# Patient Record
Sex: Male | Born: 1935 | Race: White | Hispanic: No | Marital: Married | Smoking: Former smoker
Health system: Southern US, Community
[De-identification: ages and names within clinical notes are randomized; demographics above are authoritative.]

## PROBLEM LIST (undated history)

## (undated) DIAGNOSIS — G20 Parkinson's disease: Secondary | ICD-10-CM

## (undated) DIAGNOSIS — S72009A Fracture of unspecified part of neck of unspecified femur, initial encounter for closed fracture: Secondary | ICD-10-CM

## (undated) DIAGNOSIS — F419 Anxiety disorder, unspecified: Secondary | ICD-10-CM

## (undated) DIAGNOSIS — I10 Essential (primary) hypertension: Secondary | ICD-10-CM

## (undated) DIAGNOSIS — F32A Depression, unspecified: Secondary | ICD-10-CM

## (undated) DIAGNOSIS — G20A1 Parkinson's disease without dyskinesia, without mention of fluctuations: Secondary | ICD-10-CM

## (undated) DIAGNOSIS — F329 Major depressive disorder, single episode, unspecified: Secondary | ICD-10-CM

## (undated) DIAGNOSIS — J449 Chronic obstructive pulmonary disease, unspecified: Secondary | ICD-10-CM

## (undated) DIAGNOSIS — C801 Malignant (primary) neoplasm, unspecified: Secondary | ICD-10-CM

## (undated) DIAGNOSIS — I639 Cerebral infarction, unspecified: Secondary | ICD-10-CM

## (undated) DIAGNOSIS — E785 Hyperlipidemia, unspecified: Secondary | ICD-10-CM

## (undated) HISTORY — DX: Chronic obstructive pulmonary disease, unspecified: J44.9

## (undated) HISTORY — PX: HERNIA REPAIR: SHX51

## (undated) HISTORY — DX: Depression, unspecified: F32.A

## (undated) HISTORY — DX: Hyperlipidemia, unspecified: E78.5

## (undated) HISTORY — DX: Major depressive disorder, single episode, unspecified: F32.9

## (undated) HISTORY — DX: Malignant (primary) neoplasm, unspecified: C80.1

## (undated) HISTORY — DX: Anxiety disorder, unspecified: F41.9

## (undated) HISTORY — PX: EYE SURGERY: SHX253

## (undated) HISTORY — DX: Essential (primary) hypertension: I10

## (undated) HISTORY — PX: FRACTURE SURGERY: SHX138

---

## 2003-01-23 ENCOUNTER — Emergency Department (HOSPITAL_COMMUNITY): Admission: EM | Admit: 2003-01-23 | Discharge: 2003-01-23 | Payer: Self-pay | Admitting: Emergency Medicine

## 2004-08-18 ENCOUNTER — Ambulatory Visit: Payer: Self-pay | Admitting: Internal Medicine

## 2004-08-29 ENCOUNTER — Ambulatory Visit: Payer: Self-pay | Admitting: Internal Medicine

## 2005-11-06 ENCOUNTER — Emergency Department (HOSPITAL_COMMUNITY): Admission: EM | Admit: 2005-11-06 | Discharge: 2005-11-07 | Payer: Self-pay | Admitting: Emergency Medicine

## 2007-04-27 ENCOUNTER — Inpatient Hospital Stay (HOSPITAL_COMMUNITY): Admission: EM | Admit: 2007-04-27 | Discharge: 2007-05-05 | Payer: Self-pay | Admitting: Emergency Medicine

## 2007-04-27 ENCOUNTER — Ambulatory Visit: Payer: Self-pay | Admitting: Vascular Surgery

## 2007-04-28 ENCOUNTER — Encounter (INDEPENDENT_AMBULATORY_CARE_PROVIDER_SITE_OTHER): Payer: Self-pay | Admitting: Internal Medicine

## 2007-08-15 ENCOUNTER — Emergency Department (HOSPITAL_COMMUNITY): Admission: EM | Admit: 2007-08-15 | Discharge: 2007-08-16 | Payer: Self-pay | Admitting: Emergency Medicine

## 2008-10-02 ENCOUNTER — Inpatient Hospital Stay (HOSPITAL_COMMUNITY): Admission: EM | Admit: 2008-10-02 | Discharge: 2008-10-08 | Payer: Self-pay | Admitting: Emergency Medicine

## 2008-10-05 ENCOUNTER — Ambulatory Visit: Payer: Self-pay | Admitting: Physical Medicine & Rehabilitation

## 2008-10-08 ENCOUNTER — Inpatient Hospital Stay (HOSPITAL_COMMUNITY)
Admission: RE | Admit: 2008-10-08 | Discharge: 2008-10-24 | Payer: Self-pay | Admitting: Physical Medicine & Rehabilitation

## 2008-10-08 ENCOUNTER — Ambulatory Visit: Payer: Self-pay | Admitting: Physical Medicine & Rehabilitation

## 2008-12-08 ENCOUNTER — Inpatient Hospital Stay (HOSPITAL_COMMUNITY): Admission: EM | Admit: 2008-12-08 | Discharge: 2008-12-21 | Payer: Self-pay | Admitting: Emergency Medicine

## 2008-12-26 ENCOUNTER — Inpatient Hospital Stay (HOSPITAL_COMMUNITY): Admission: EM | Admit: 2008-12-26 | Discharge: 2009-01-04 | Payer: Self-pay | Admitting: Emergency Medicine

## 2010-02-10 ENCOUNTER — Encounter: Payer: Self-pay | Admitting: Physical Medicine & Rehabilitation

## 2010-04-21 LAB — CBC
HCT: 29.2 % — ABNORMAL LOW (ref 39.0–52.0)
Hemoglobin: 10 g/dL — ABNORMAL LOW (ref 13.0–17.0)
Hemoglobin: 9.4 g/dL — ABNORMAL LOW (ref 13.0–17.0)
MCHC: 32.1 g/dL (ref 30.0–36.0)
MCHC: 32.2 g/dL (ref 30.0–36.0)
MCV: 90.3 fL (ref 78.0–100.0)
Platelets: 323 10*3/uL (ref 150–400)
RBC: 3.23 MIL/uL — ABNORMAL LOW (ref 4.22–5.81)
RDW: 16.2 % — ABNORMAL HIGH (ref 11.5–15.5)
WBC: 5.8 10*3/uL (ref 4.0–10.5)

## 2010-04-21 LAB — BASIC METABOLIC PANEL
BUN: 5 mg/dL — ABNORMAL LOW (ref 6–23)
CO2: 30 mEq/L (ref 19–32)
CO2: 31 mEq/L (ref 19–32)
Calcium: 8 mg/dL — ABNORMAL LOW (ref 8.4–10.5)
Chloride: 103 mEq/L (ref 96–112)
GFR calc Af Amer: >60 mL/min (ref >60)
GFR calc non Af Amer: >60 mL/min (ref >60)
Glucose, Bld: 107 mg/dL — ABNORMAL HIGH (ref 70–99)
Potassium: 3.4 mEq/L — ABNORMAL LOW (ref 3.5–5.1)
Sodium: 139 mEq/L (ref 135–145)
Sodium: 140 mEq/L (ref 135–145)

## 2010-04-21 LAB — DIFFERENTIAL
Basophils Absolute: 0.1 10*3/uL (ref 0.0–0.1)
Basophils Relative: 0 % (ref 0–1)
Basophils Relative: 1 % (ref 0–1)
Eosinophils Absolute: 0.1 10*3/uL (ref 0.0–0.7)
Eosinophils Relative: 1 % (ref 0–5)
Eosinophils Relative: 2 % (ref 0–5)
Lymphs Abs: 1.4 10*3/uL (ref 0.7–4.0)
Monocytes Absolute: 0.5 10*3/uL (ref 0.1–1.0)
Monocytes Absolute: 0.6 10*3/uL (ref 0.1–1.0)
Monocytes Relative: 9 % (ref 3–12)
Neutro Abs: 6.8 10*3/uL (ref 1.7–7.7)
Neutrophils Relative %: 64 % (ref 43–77)

## 2010-04-22 LAB — HEMOCCULT GUIAC POC 1CARD (OFFICE): Fecal Occult Bld: NEGATIVE

## 2010-04-22 LAB — CBC
HCT: 30.5 % — ABNORMAL LOW (ref 39.0–52.0)
HCT: 37 % — ABNORMAL LOW (ref 39.0–52.0)
HCT: 27.1 % — ABNORMAL LOW (ref 39.0–52.0)
HCT: 27.4 % — ABNORMAL LOW (ref 39.0–52.0)
HCT: 28.8 % — ABNORMAL LOW (ref 39.0–52.0)
Hemoglobin: 10.3 g/dL — ABNORMAL LOW (ref 13.0–17.0)
Hemoglobin: 9.4 g/dL — ABNORMAL LOW (ref 13.0–17.0)
Hemoglobin: 12 g/dL — ABNORMAL LOW (ref 13.0–17.0)
Hemoglobin: 9.3 g/dL — ABNORMAL LOW (ref 13.0–17.0)
Hemoglobin: 7.9 g/dL — ABNORMAL LOW (ref 13.0–17.0)
Hemoglobin: 9 g/dL — ABNORMAL LOW (ref 13.0–17.0)
Hemoglobin: 9.4 g/dL — ABNORMAL LOW (ref 13.0–17.0)
MCHC: 33.5 g/dL (ref 30.0–36.0)
MCHC: 32.5 g/dL (ref 30.0–36.0)
MCHC: 32.8 g/dL (ref 30.0–36.0)
MCHC: 31.9 g/dL (ref 30.0–36.0)
MCHC: 33 g/dL (ref 30.0–36.0)
MCV: 89.8 fL (ref 78.0–100.0)
MCV: 90 fL (ref 78.0–100.0)
Platelets: 294 10*3/uL (ref 150–400)
Platelets: 313 10*3/uL (ref 150–400)
Platelets: 337 10*3/uL (ref 150–400)
Platelets: 255 10*3/uL (ref 150–400)
Platelets: 328 10*3/uL (ref 150–400)
Platelets: 211 10*3/uL (ref 150–400)
Platelets: 244 10*3/uL (ref 150–400)
RBC: 3.11 MIL/uL — ABNORMAL LOW (ref 4.22–5.81)
RBC: 3.15 MIL/uL — ABNORMAL LOW (ref 4.22–5.81)
RBC: 2.69 MIL/uL — ABNORMAL LOW (ref 4.22–5.81)
RBC: 3.01 MIL/uL — ABNORMAL LOW (ref 4.22–5.81)
RBC: 3.17 MIL/uL — ABNORMAL LOW (ref 4.22–5.81)
RBC: 3.29 MIL/uL — ABNORMAL LOW (ref 4.22–5.81)
RDW: 15.7 % — ABNORMAL HIGH (ref 11.5–15.5)
RDW: 16.3 % — ABNORMAL HIGH (ref 11.5–15.5)
RDW: 16.5 % — ABNORMAL HIGH (ref 11.5–15.5)
RDW: 16.6 % — ABNORMAL HIGH (ref 11.5–15.5)
WBC: 7.6 10*3/uL (ref 4.0–10.5)
WBC: 7.8 10*3/uL (ref 4.0–10.5)
WBC: 9.2 10*3/uL (ref 4.0–10.5)
WBC: 10.1 10*3/uL (ref 4.0–10.5)
WBC: 10.5 10*3/uL (ref 4.0–10.5)
WBC: 6.8 10*3/uL (ref 4.0–10.5)
WBC: 8.5 10*3/uL (ref 4.0–10.5)

## 2010-04-22 LAB — DIFFERENTIAL
Basophils Absolute: 0.3 10*3/uL — ABNORMAL HIGH (ref 0.0–0.1)
Basophils Absolute: 0 10*3/uL (ref 0.0–0.1)
Basophils Relative: 0 % (ref 0–1)
Basophils Relative: 0 % (ref 0–1)
Eosinophils Absolute: 0 10*3/uL (ref 0.0–0.7)
Eosinophils Relative: 0 % (ref 0–5)
Eosinophils Relative: 0 % (ref 0–5)
Eosinophils Relative: 0 % (ref 0–5)
Eosinophils Relative: 1 % (ref 0–5)
Lymphocytes Relative: 6 % — ABNORMAL LOW (ref 12–46)
Lymphocytes Relative: 12 % (ref 12–46)
Lymphocytes Relative: 14 % (ref 12–46)
Lymphocytes Relative: 19 % (ref 12–46)
Lymphocytes Relative: 20 % (ref 12–46)
Lymphocytes Relative: 20 % (ref 12–46)
Lymphs Abs: 1.5 10*3/uL (ref 0.7–4.0)
Lymphs Abs: 1.6 10*3/uL (ref 0.7–4.0)
Lymphs Abs: 1.8 10*3/uL (ref 0.7–4.0)
Lymphs Abs: 2 10*3/uL (ref 0.7–4.0)
Monocytes Absolute: 0.5 10*3/uL (ref 0.1–1.0)
Monocytes Absolute: 0.5 10*3/uL (ref 0.1–1.0)
Monocytes Absolute: 0.7 10*3/uL (ref 0.1–1.0)
Monocytes Relative: 2 % — ABNORMAL LOW (ref 3–12)
Monocytes Relative: 5 % (ref 3–12)
Monocytes Relative: 5 % (ref 3–12)
Monocytes Relative: 6 % (ref 3–12)
Monocytes Relative: 6 % (ref 3–12)
Monocytes Relative: 6 % (ref 3–12)
Neutro Abs: 12.1 10*3/uL — ABNORMAL HIGH (ref 1.7–7.7)
Neutro Abs: 4.5 10*3/uL (ref 1.7–7.7)
Neutro Abs: 6.9 10*3/uL (ref 1.7–7.7)
Neutro Abs: 7.4 10*3/uL (ref 1.7–7.7)
Neutrophils Relative %: 91 % — ABNORMAL HIGH (ref 43–77)
Neutrophils Relative %: 67 % (ref 43–77)
Neutrophils Relative %: 73 % (ref 43–77)
Neutrophils Relative %: 74 % (ref 43–77)
Neutrophils Relative %: 83 % — ABNORMAL HIGH (ref 43–77)

## 2010-04-22 LAB — CLOSTRIDIUM DIFFICILE EIA: C difficile Toxins A+B, EIA: 10

## 2010-04-22 LAB — BASIC METABOLIC PANEL
BUN: 12 mg/dL (ref 6–23)
BUN: 12 mg/dL (ref 6–23)
BUN: 12 mg/dL (ref 6–23)
BUN: 13 mg/dL (ref 6–23)
CO2: 32 mEq/L (ref 19–32)
Calcium: 7.9 mg/dL — ABNORMAL LOW (ref 8.4–10.5)
Calcium: 8 mg/dL — ABNORMAL LOW (ref 8.4–10.5)
Calcium: 8.1 mg/dL — ABNORMAL LOW (ref 8.4–10.5)
Calcium: 7.6 mg/dL — ABNORMAL LOW (ref 8.4–10.5)
Calcium: 7.8 mg/dL — ABNORMAL LOW (ref 8.4–10.5)
Calcium: 7.9 mg/dL — ABNORMAL LOW (ref 8.4–10.5)
Calcium: 8 mg/dL — ABNORMAL LOW (ref 8.4–10.5)
Chloride: 106 mEq/L (ref 96–112)
Creatinine, Ser: 0.56 mg/dL (ref 0.4–1.5)
Creatinine, Ser: 0.66 mg/dL (ref 0.4–1.5)
Creatinine, Ser: 0.49 mg/dL (ref 0.4–1.5)
Creatinine, Ser: 0.52 mg/dL (ref 0.4–1.5)
Creatinine, Ser: 0.56 mg/dL (ref 0.4–1.5)
GFR calc Af Amer: >60 mL/min (ref >60)
GFR calc Af Amer: >60 mL/min (ref >60)
GFR calc Af Amer: >60 mL/min (ref >60)
GFR calc Af Amer: >60 mL/min (ref >60)
GFR calc Af Amer: >60 mL/min (ref >60)
GFR calc Af Amer: >60 mL/min (ref >60)
GFR calc Af Amer: >60 mL/min (ref >60)
GFR calc non Af Amer: >60 mL/min (ref >60)
GFR calc non Af Amer: >60 mL/min (ref >60)
GFR calc non Af Amer: >60 mL/min (ref >60)
GFR calc non Af Amer: >60 mL/min (ref >60)
GFR calc non Af Amer: >60 mL/min (ref >60)
GFR calc non Af Amer: >60 mL/min (ref >60)
GFR calc non Af Amer: >60 mL/min (ref >60)
GFR calc non Af Amer: >60 mL/min (ref >60)
GFR calc non Af Amer: >60 mL/min (ref >60)
Glucose, Bld: 102 mg/dL — ABNORMAL HIGH (ref 70–99)
Glucose, Bld: 131 mg/dL — ABNORMAL HIGH (ref 70–99)
Potassium: 2.9 mEq/L — ABNORMAL LOW (ref 3.5–5.1)
Potassium: 3.5 mEq/L (ref 3.5–5.1)
Potassium: 3.8 mEq/L (ref 3.5–5.1)
Potassium: 3.9 mEq/L (ref 3.5–5.1)
Sodium: 136 mEq/L (ref 135–145)
Sodium: 138 mEq/L (ref 135–145)
Sodium: 143 mEq/L (ref 135–145)
Sodium: 140 mEq/L (ref 135–145)
Sodium: 141 mEq/L (ref 135–145)

## 2010-04-22 LAB — ANAEROBIC CULTURE

## 2010-04-22 LAB — URINE MICROSCOPIC-ADD ON

## 2010-04-22 LAB — URINALYSIS, ROUTINE W REFLEX MICROSCOPIC
Bilirubin Urine: NEGATIVE
Bilirubin Urine: NEGATIVE
Glucose, UA: NEGATIVE mg/dL
Hgb urine dipstick: NEGATIVE
Hgb urine dipstick: NEGATIVE
Ketones, ur: NEGATIVE mg/dL
Ketones, ur: 15 mg/dL — AB
Nitrite: POSITIVE — AB
Urobilinogen, UA: 0.2 mg/dL (ref 0.0–1.0)
pH: 6.5 (ref 5.0–8.0)

## 2010-04-22 LAB — WOUND CULTURE: Culture: NO GROWTH

## 2010-04-22 LAB — MAGNESIUM
Magnesium: 2 mg/dL (ref 1.5–2.5)
Magnesium: 2.1 mg/dL (ref 1.5–2.5)
Magnesium: 2.4 mg/dL (ref 1.5–2.5)

## 2010-04-22 LAB — HEMOGLOBIN AND HEMATOCRIT, BLOOD
HCT: 26.4 % — ABNORMAL LOW (ref 39.0–52.0)
HCT: 29.5 % — ABNORMAL LOW (ref 39.0–52.0)
Hemoglobin: 8.5 g/dL — ABNORMAL LOW (ref 13.0–17.0)
Hemoglobin: 9.4 g/dL — ABNORMAL LOW (ref 13.0–17.0)

## 2010-04-22 LAB — CULTURE, BLOOD (ROUTINE X 2)

## 2010-04-22 LAB — COMPREHENSIVE METABOLIC PANEL
Albumin: 3.4 g/dL — ABNORMAL LOW (ref 3.5–5.2)
BUN: 13 mg/dL (ref 6–23)
Calcium: 8.8 mg/dL (ref 8.4–10.5)
Glucose, Bld: 133 mg/dL — ABNORMAL HIGH (ref 70–99)
Potassium: 3.7 mEq/L (ref 3.5–5.1)
Total Protein: 6.6 g/dL (ref 6.0–8.3)

## 2010-04-22 LAB — CROSSMATCH: Antibody Screen: NEGATIVE

## 2010-04-22 LAB — PROTIME-INR
INR: 2.02 — ABNORMAL HIGH (ref 0.00–1.49)
INR: 2.3 — ABNORMAL HIGH (ref 0.00–1.49)
INR: 1.65 — ABNORMAL HIGH (ref 0.00–1.49)
Prothrombin Time: 22.7 seconds — ABNORMAL HIGH (ref 11.6–15.2)
Prothrombin Time: 27.2 seconds — ABNORMAL HIGH (ref 11.6–15.2)
Prothrombin Time: 19.4 seconds — ABNORMAL HIGH (ref 11.6–15.2)
Prothrombin Time: 20.1 seconds — ABNORMAL HIGH (ref 11.6–15.2)

## 2010-04-22 LAB — URINE CULTURE
Colony Count: NO GROWTH
Culture: NO GROWTH
Culture: NO GROWTH
Special Requests: NEGATIVE

## 2010-04-22 LAB — APTT: aPTT: 38 seconds — ABNORMAL HIGH (ref 24–37)

## 2010-04-22 LAB — GRAM STAIN

## 2010-04-22 LAB — PHOSPHORUS: Phosphorus: 3.5 mg/dL (ref 2.3–4.6)

## 2010-04-23 LAB — CK TOTAL AND CKMB (NOT AT ARMC)
CK, MB: 1.9 ng/mL (ref 0.3–4.0)
Total CK: 159 U/L (ref 7–232)
Total CK: 160 U/L (ref 7–232)
Total CK: 22 U/L (ref 7–232)

## 2010-04-23 LAB — BASIC METABOLIC PANEL
Calcium: 8.1 mg/dL — ABNORMAL LOW (ref 8.4–10.5)
Calcium: 8.3 mg/dL — ABNORMAL LOW (ref 8.4–10.5)
Calcium: 9 mg/dL (ref 8.4–10.5)
Creatinine, Ser: 0.54 mg/dL (ref 0.4–1.5)
GFR calc Af Amer: >60 mL/min (ref >60)
GFR calc Af Amer: >60 mL/min (ref >60)
GFR calc Af Amer: >60 mL/min (ref >60)
GFR calc Af Amer: >60 mL/min (ref >60)
GFR calc non Af Amer: >60 mL/min (ref >60)
GFR calc non Af Amer: >60 mL/min (ref >60)
GFR calc non Af Amer: >60 mL/min (ref >60)
GFR calc non Af Amer: >60 mL/min (ref >60)
Glucose, Bld: 106 mg/dL — ABNORMAL HIGH (ref 70–99)
Glucose, Bld: 116 mg/dL — ABNORMAL HIGH (ref 70–99)
Potassium: 3.4 mEq/L — ABNORMAL LOW (ref 3.5–5.1)
Potassium: 3.5 mEq/L (ref 3.5–5.1)
Sodium: 133 mEq/L — ABNORMAL LOW (ref 135–145)
Sodium: 135 mEq/L (ref 135–145)
Sodium: 138 mEq/L (ref 135–145)
Sodium: 141 mEq/L (ref 135–145)

## 2010-04-23 LAB — PROTIME-INR
INR: 2.17 — ABNORMAL HIGH (ref 0.00–1.49)
INR: 2.3 — ABNORMAL HIGH (ref 0.00–1.49)
INR: 2.33 — ABNORMAL HIGH (ref 0.00–1.49)
INR: 2.33 — ABNORMAL HIGH (ref 0.00–1.49)
INR: 2.68 — ABNORMAL HIGH (ref 0.00–1.49)
INR: 2.78 — ABNORMAL HIGH (ref 0.00–1.49)
INR: 2.89 — ABNORMAL HIGH (ref 0.00–1.49)
Prothrombin Time: 13.3 seconds (ref 11.6–15.2)
Prothrombin Time: 17.8 seconds — ABNORMAL HIGH (ref 11.6–15.2)
Prothrombin Time: 24 seconds — ABNORMAL HIGH (ref 11.6–15.2)
Prothrombin Time: 25.1 seconds — ABNORMAL HIGH (ref 11.6–15.2)
Prothrombin Time: 25.4 seconds — ABNORMAL HIGH (ref 11.6–15.2)
Prothrombin Time: 29.1 seconds — ABNORMAL HIGH (ref 11.6–15.2)
Prothrombin Time: 30.6 seconds — ABNORMAL HIGH (ref 11.6–15.2)

## 2010-04-23 LAB — URINE MICROSCOPIC-ADD ON

## 2010-04-23 LAB — DIFFERENTIAL
Basophils Absolute: 0 10*3/uL (ref 0.0–0.1)
Lymphocytes Relative: 17 % (ref 12–46)
Lymphs Abs: 1.9 10*3/uL (ref 0.7–4.0)
Monocytes Absolute: 0.6 10*3/uL (ref 0.1–1.0)
Neutro Abs: 8.3 10*3/uL — ABNORMAL HIGH (ref 1.7–7.7)

## 2010-04-23 LAB — TROPONIN I
Troponin I: 0.02 ng/mL (ref 0.00–0.06)
Troponin I: 0.02 ng/mL (ref 0.00–0.06)

## 2010-04-23 LAB — CBC
HCT: 28.2 % — ABNORMAL LOW (ref 39.0–52.0)
HCT: 31.6 % — ABNORMAL LOW (ref 39.0–52.0)
Hemoglobin: 10.3 g/dL — ABNORMAL LOW (ref 13.0–17.0)
Hemoglobin: 10.9 g/dL — ABNORMAL LOW (ref 13.0–17.0)
Hemoglobin: 12.8 g/dL — ABNORMAL LOW (ref 13.0–17.0)
Hemoglobin: 12.9 g/dL — ABNORMAL LOW (ref 13.0–17.0)
Hemoglobin: 8.9 g/dL — ABNORMAL LOW (ref 13.0–17.0)
Hemoglobin: 9.7 g/dL — ABNORMAL LOW (ref 13.0–17.0)
MCHC: 34.5 g/dL (ref 30.0–36.0)
MCHC: 34.5 g/dL (ref 30.0–36.0)
MCV: 89.4 fL (ref 78.0–100.0)
MCV: 90 fL (ref 78.0–100.0)
Platelets: 175 10*3/uL (ref 150–400)
RBC: 2.89 MIL/uL — ABNORMAL LOW (ref 4.22–5.81)
RBC: 3.17 MIL/uL — ABNORMAL LOW (ref 4.22–5.81)
RBC: 3.54 MIL/uL — ABNORMAL LOW (ref 4.22–5.81)
RBC: 4.18 MIL/uL — ABNORMAL LOW (ref 4.22–5.81)
RBC: 4.2 MIL/uL — ABNORMAL LOW (ref 4.22–5.81)
RDW: 14.8 % (ref 11.5–15.5)
RDW: 15.3 % (ref 11.5–15.5)
WBC: 10.1 10*3/uL (ref 4.0–10.5)
WBC: 10.9 10*3/uL — ABNORMAL HIGH (ref 4.0–10.5)
WBC: 12.4 10*3/uL — ABNORMAL HIGH (ref 4.0–10.5)
WBC: 13 10*3/uL — ABNORMAL HIGH (ref 4.0–10.5)

## 2010-04-23 LAB — COMPREHENSIVE METABOLIC PANEL
Albumin: 3.7 g/dL (ref 3.5–5.2)
BUN: 13 mg/dL (ref 6–23)
Calcium: 9.2 mg/dL (ref 8.4–10.5)
Chloride: 101 mEq/L (ref 96–112)
Creatinine, Ser: 0.69 mg/dL (ref 0.4–1.5)
Total Bilirubin: 1.5 mg/dL — ABNORMAL HIGH (ref 0.3–1.2)
Total Protein: 6.7 g/dL (ref 6.0–8.3)

## 2010-04-23 LAB — URINALYSIS, ROUTINE W REFLEX MICROSCOPIC
Glucose, UA: NEGATIVE mg/dL
Glucose, UA: NEGATIVE mg/dL
Hgb urine dipstick: NEGATIVE
Ketones, ur: 40 mg/dL — AB
Leukocytes, UA: NEGATIVE
Nitrite: NEGATIVE
Nitrite: NEGATIVE
Protein, ur: NEGATIVE mg/dL
Protein, ur: NEGATIVE mg/dL
Specific Gravity, Urine: 1.024 (ref 1.005–1.030)
pH: 5.5 (ref 5.0–8.0)
pH: 5.5 (ref 5.0–8.0)
pH: 6 (ref 5.0–8.0)

## 2010-04-23 LAB — URINE CULTURE
Colony Count: NO GROWTH
Colony Count: NO GROWTH
Culture: NO GROWTH

## 2010-04-23 LAB — APTT: aPTT: 25 seconds (ref 24–37)

## 2010-04-24 LAB — BASIC METABOLIC PANEL
BUN: 16 mg/dL (ref 6–23)
CO2: 30 mEq/L (ref 19–32)
Calcium: 8.4 mg/dL (ref 8.4–10.5)
GFR calc non Af Amer: >60 mL/min (ref >60)
Glucose, Bld: 93 mg/dL (ref 70–99)
Sodium: 135 mEq/L (ref 135–145)

## 2010-04-24 LAB — PROTIME-INR
INR: 2.32 — ABNORMAL HIGH (ref 0.00–1.49)
Prothrombin Time: 30.9 seconds — ABNORMAL HIGH (ref 11.6–15.2)
Prothrombin Time: 32 seconds — ABNORMAL HIGH (ref 11.6–15.2)

## 2010-04-25 LAB — CK TOTAL AND CKMB (NOT AT ARMC)
Relative Index: 1.3 (ref 0.0–2.5)
Total CK: 258 U/L — ABNORMAL HIGH (ref 7–232)

## 2010-04-25 LAB — DIFFERENTIAL
Eosinophils Absolute: 0 10*3/uL (ref 0.0–0.7)
Eosinophils Relative: 1 % (ref 0–5)
Eosinophils Relative: 0 % (ref 0–5)
Lymphocytes Relative: 16 % (ref 12–46)
Lymphocytes Relative: 7 % — ABNORMAL LOW (ref 12–46)
Lymphs Abs: 1.6 10*3/uL (ref 0.7–4.0)
Lymphs Abs: 1.4 10*3/uL (ref 0.7–4.0)
Monocytes Absolute: 0.8 10*3/uL (ref 0.1–1.0)
Monocytes Absolute: 0.4 10*3/uL (ref 0.1–1.0)
Monocytes Relative: 2 % — ABNORMAL LOW (ref 3–12)
Neutro Abs: 18.2 10*3/uL — ABNORMAL HIGH (ref 1.7–7.7)
Neutrophils Relative %: 91 % — ABNORMAL HIGH (ref 43–77)

## 2010-04-25 LAB — CBC
HCT: 34.9 % — ABNORMAL LOW (ref 39.0–52.0)
HCT: 30 % — ABNORMAL LOW (ref 39.0–52.0)
HCT: 30.7 % — ABNORMAL LOW (ref 39.0–52.0)
HCT: 34.1 % — ABNORMAL LOW (ref 39.0–52.0)
Hemoglobin: 10.6 g/dL — ABNORMAL LOW (ref 13.0–17.0)
Hemoglobin: 11.7 g/dL — ABNORMAL LOW (ref 13.0–17.0)
MCHC: 34 g/dL (ref 30.0–36.0)
MCHC: 34.7 g/dL (ref 30.0–36.0)
MCHC: 34 g/dL (ref 30.0–36.0)
MCV: 94.1 fL (ref 78.0–100.0)
MCV: 92.4 fL (ref 78.0–100.0)
MCV: 93.1 fL (ref 78.0–100.0)
MCV: 93.5 fL (ref 78.0–100.0)
Platelets: 304 10*3/uL (ref 150–400)
Platelets: 344 10*3/uL (ref 150–400)
Platelets: 139 10*3/uL — ABNORMAL LOW (ref 150–400)
Platelets: 150 10*3/uL (ref 150–400)
RBC: 3.37 MIL/uL — ABNORMAL LOW (ref 4.22–5.81)
RBC: 3.73 MIL/uL — ABNORMAL LOW (ref 4.22–5.81)
RBC: 3.65 MIL/uL — ABNORMAL LOW (ref 4.22–5.81)
RDW: 13 % (ref 11.5–15.5)
RDW: 13.3 % (ref 11.5–15.5)
RDW: 13.1 % (ref 11.5–15.5)
WBC: 10.2 10*3/uL (ref 4.0–10.5)
WBC: 18.3 10*3/uL — ABNORMAL HIGH (ref 4.0–10.5)
WBC: 12.1 10*3/uL — ABNORMAL HIGH (ref 4.0–10.5)

## 2010-04-25 LAB — URINALYSIS, ROUTINE W REFLEX MICROSCOPIC
Bilirubin Urine: NEGATIVE
Glucose, UA: 100 mg/dL — AB
Hgb urine dipstick: NEGATIVE
Protein, ur: 30 mg/dL — AB
Specific Gravity, Urine: 1.025 (ref 1.005–1.030)
Urobilinogen, UA: 1 mg/dL (ref 0.0–1.0)
pH: 5 (ref 5.0–8.0)

## 2010-04-25 LAB — BASIC METABOLIC PANEL
BUN: 22 mg/dL (ref 6–23)
BUN: 12 mg/dL (ref 6–23)
CO2: 31 mEq/L (ref 19–32)
Calcium: 8.6 mg/dL (ref 8.4–10.5)
Chloride: 100 mEq/L (ref 96–112)
Chloride: 99 mEq/L (ref 96–112)
Creatinine, Ser: 0.73 mg/dL (ref 0.4–1.5)
GFR calc non Af Amer: >60 mL/min (ref >60)
GFR calc non Af Amer: >60 mL/min (ref >60)
Glucose, Bld: 92 mg/dL (ref 70–99)
Glucose, Bld: 101 mg/dL — ABNORMAL HIGH (ref 70–99)
Glucose, Bld: 159 mg/dL — ABNORMAL HIGH (ref 70–99)
Potassium: 3.9 mEq/L (ref 3.5–5.1)
Potassium: 3.3 mEq/L — ABNORMAL LOW (ref 3.5–5.1)
Potassium: 3.9 mEq/L (ref 3.5–5.1)
Sodium: 136 mEq/L (ref 135–145)

## 2010-04-25 LAB — TYPE AND SCREEN: ABO/RH(D): A POS

## 2010-04-25 LAB — COMPREHENSIVE METABOLIC PANEL
ALT: 13 U/L (ref 0–53)
AST: 48 U/L — ABNORMAL HIGH (ref 0–37)
Albumin: 3.2 g/dL — ABNORMAL LOW (ref 3.5–5.2)
Alkaline Phosphatase: 88 U/L (ref 39–117)
BUN: 11 mg/dL (ref 6–23)
CO2: 26 mEq/L (ref 19–32)
Calcium: 9 mg/dL (ref 8.4–10.5)
Chloride: 104 mEq/L (ref 96–112)
Chloride: 100 mEq/L (ref 96–112)
Creatinine, Ser: 0.84 mg/dL (ref 0.4–1.5)
Creatinine, Ser: 0.79 mg/dL (ref 0.4–1.5)
GFR calc Af Amer: >60 mL/min (ref >60)
GFR calc non Af Amer: >60 mL/min (ref >60)
Glucose, Bld: 139 mg/dL — ABNORMAL HIGH (ref 70–99)
Potassium: 3.7 mEq/L (ref 3.5–5.1)
Sodium: 139 mEq/L (ref 135–145)
Total Bilirubin: 2.2 mg/dL — ABNORMAL HIGH (ref 0.3–1.2)

## 2010-04-25 LAB — URINE MICROSCOPIC-ADD ON

## 2010-04-25 LAB — PROTIME-INR
INR: 1.9 — ABNORMAL HIGH (ref 0.00–1.49)
INR: 2.3 — ABNORMAL HIGH (ref 0.00–1.49)
INR: 2.3 — ABNORMAL HIGH (ref 0.00–1.49)
INR: 2.4 — ABNORMAL HIGH (ref 0.00–1.49)
INR: 2 — ABNORMAL HIGH (ref 0.00–1.49)
INR: 2.5 — ABNORMAL HIGH (ref 0.00–1.49)
INR: 1.1 (ref 0.00–1.49)
Prothrombin Time: 21.2 seconds — ABNORMAL HIGH (ref 11.6–15.2)
Prothrombin Time: 23.6 seconds — ABNORMAL HIGH (ref 11.6–15.2)
Prothrombin Time: 25 seconds — ABNORMAL HIGH (ref 11.6–15.2)
Prothrombin Time: 25.1 seconds — ABNORMAL HIGH (ref 11.6–15.2)
Prothrombin Time: 25.7 seconds — ABNORMAL HIGH (ref 11.6–15.2)
Prothrombin Time: 27.6 seconds — ABNORMAL HIGH (ref 11.6–15.2)
Prothrombin Time: 22.8 seconds — ABNORMAL HIGH (ref 11.6–15.2)
Prothrombin Time: 25.5 seconds — ABNORMAL HIGH (ref 11.6–15.2)
Prothrombin Time: 25.8 seconds — ABNORMAL HIGH (ref 11.6–15.2)
Prothrombin Time: 14.4 seconds (ref 11.6–15.2)

## 2010-04-25 LAB — URINE CULTURE: Special Requests: NEGATIVE

## 2010-04-25 LAB — HEPATIC FUNCTION PANEL
Indirect Bilirubin: 1 mg/dL — ABNORMAL HIGH (ref 0.3–0.9)
Total Protein: 5.7 g/dL — ABNORMAL LOW (ref 6.0–8.3)

## 2010-04-25 LAB — CULTURE, RESPIRATORY W GRAM STAIN

## 2010-04-25 LAB — POCT I-STAT, CHEM 8
BUN: 17 mg/dL (ref 6–23)
Chloride: 102 mEq/L (ref 96–112)
Creatinine, Ser: 0.8 mg/dL (ref 0.4–1.5)
Glucose, Bld: 130 mg/dL — ABNORMAL HIGH (ref 70–99)
Potassium: 3.8 mEq/L (ref 3.5–5.1)
Sodium: 137 mEq/L (ref 135–145)

## 2010-04-25 LAB — CK: Total CK: 384 U/L — ABNORMAL HIGH (ref 7–232)

## 2010-06-03 NOTE — Consult Note (Signed)
Cole Cunningham, Cole Cunningham       ACCOUNT NO.:  [id number]   MEDICAL RECORD NO.:  [id number]          PATIENT TYPE:  EMS   LOCATION:  MAJO                         FACILITY:  [facility]   PHYSICIAN:  Bevelyn Buckles. Champey, M.D.DATE OF BIRTH:  1932-02-01   DATE OF CONSULTATION:  DATE OF DISCHARGE:                                 CONSULTATION   REFERRING PHYSICIAN:  Dr. Shelly Bombard   REASON FOR CONSULTATION:  Stroke.   HISTORY OF PRESENT ILLNESS:  Cole Cunningham is a 77 year old Asian  male with non-significant past medical history except for excessive  smoker who presents with disorientation, speech difficulties/slurred  speech and right-sided weakness since yesterday.  His wife noticed his  symptoms when she returned from work around 3 p.m. and states she does  not know the exact onset.  The patient also has been unsteady on his  feet secondary to weakness.  Denies any dizziness, vertigo, or loss of  consciousness.  He did have a similar episode 1 year ago as per family.  The patient does complain of some bilateral tremors in his arms and legs  as well.   PAST MEDICAL HISTORY:  None.   MEDICATIONS:  None.   ALLERGIES:  None.   FAMILY HISTORY:  Positive for diabetes.   SOCIAL HISTORY:  The patient lives with wife.  Smokes 1 to 2 packs per  day.  Denies any alcohol use.   REVIEW OF SYSTEMS:  Positive as per History of Present Illness and also  for abdominal discomfort.  Review of systems negative as per History of  Present Illness in greater than 7 other organ systems.   EXAMINATION:  VITALS:  Blood pressure is 164/62, temperature is 97.5,  pulse 58, respirations 18.  HEENT:  Normocephalic, atraumatic.  Extraocular muscles are intact.  Face is symmetric.  HEART:  Regular.  LUNGS:  Clear.  ABDOMEN:  Soft.  EXTREMITIES:  Good pulses.  NEUROLOGICAL:  Patient is awake and alert with dysarthric speech.  Cranial nerves II-XII are grossly intact.  Motor examination shows  slight  weakness, 4+/5 strength in the right extremity.  No drift is  noted.  Sensory examination is within normal to light touch.  Reflexes  are 1+ throughout.  Cerebellar function is within normal limits.  Finger-  to-nose was not assessed secondary to safety.   LABS:  CBC is 9.1, hemoglobin 15.6, hematocrit 45.3, platelets 211.  Sodium is 140, potassium is 4.3, chloride is 106, C02 is 27, BUN 16,  creatinine 1.0, glucose 117.  MRI of the head showed an acute left  lentiform nucleus infarct without arthrosclerotic disease on MRA.   IMPRESSION:  This is a 77 -year-old with a left hemispheric infarct.  Patient is not a candidate for IV TPA.  Symptoms are greater than 3  hours out.  I recommend starting an aspirin a day.  I encouraged the  patient to stop smoking.  Check carotid Dopplers and 2D echocardiogram  testing and homocysteine level.  We will get Physical  Therapy/Occupational Therapy consults.  We will follow the patient as  consultants.      Bevelyn Buckles. Nash Shearer, M.D.  Electronically Signed  DRC/MEDQ  D:  04/27/2007  T:  04/27/2007  Job:  [id number]   cc:   Dr. Shelly Bombard

## 2010-06-03 NOTE — H&P (Signed)
Cole Cunningham, Cole Cunningham       ACCOUNT NO.:  [id number]   MEDICAL RECORD NO.:  [id number]          PATIENT TYPE:  EMS   LOCATION:  MAJO                         FACILITY:  [facility]   PHYSICIAN:  Hollice Espy, M.D.DATE OF BIRTH:  1932-08-12   DATE OF ADMISSION:  04/27/2007  DATE OF DISCHARGE:                              HISTORY & PHYSICAL   PRIMARY CARE Mida Cory:  Dr. Joselyn Arrow.   CONSULTANTS:  Dr. Nash Shearer, neurology.   CHIEF COMPLAINT:  Confusion.   HISTORY OF PRESENT ILLNESS:  The patient is a 77 year old white male  with past medical history of tobacco abuse and hyperlipidemia who,  approximately 24 hours ago, started having problems with confusion,  unsteady gait, and family reports choking on food.  Symptoms persisted,  so they brought him in today.  They noted confusion and that he appeared  to be more withdrawn, sometimes exhibiting curious behavior, saying odd  things.  The patient underwent a CT scan of the head, which showed a  vague area of hypo-attenuation within the left basal ganglia.  He then  went for an MRI, which showed an acute nonhemorrhagic moderate-sized  infarct posterior left lenticular nucleus extending into the mid to  posterior left corona radiata.  He had some old small infarcts as well.  The patient's blood pressure on admission was 164/62.  He has no  reported history of hypertension.  The rest of his labs are essentially  unremarkable.  The patient himself currently is alert.  He appears to be  oriented, at least x2 when he has a very withdrawn manner.  When told  about his stroke, the fact that he has to quit smoking or what was being  done for him, he appears to be very casual about the whole thing.  The  family states this is not normally like him.  The patient tells me he  has no complaints.  Denies any headaches, vision changes, dysphagia.  No  chest pain, palpitations, shortness of breath, wheezing, or coughing.  No abdominal pain,  hematuria, dysuria, constipation, or diarrhea.  He  denies any focal history of numbness, weakness, or pain.   REVIEW OF SYSTEMS:  Otherwise, negative.   PAST MEDICAL HISTORY:  Includes tobacco abuse, episode of reported  hyperlipidemia, diet-controlled.  History of rhabdomyolysis secondary to  Statin.   MEDICATIONS:  He is not on any current medications for anything.  He has  no known drug allergies.   He smokes about 2 packs a day.  No alcohol or drug use.   FAMILY HISTORY:  Noncontributory.   PHYSICAL EXAM:  VITAL SIGNS ON ADMISSION:  Temperature 97.5, heart rate  58, blood pressure 164/62, respirations 20, O2 saturation 96% on room  air.  GENERAL:  He is alert and oriented x3.  No apparent distress.  HEENT:  Normocephalic, atraumatic.  His mucous membranes are dry.  He  has no carotid bruits.  His cranial nerves 2-12 are somewhat difficult  to assess, more to compliance than actually anything else.  Appears to  be intact.  HEART:  Regular rate and rhythm.  Soft S1, S2.  2/6 systolic ejection  murmur.  LUNGS:  Clear to auscultation bilaterally.  ABDOMEN:  Soft, nontender, nondistended.  Positive bowel sounds.  EXTREMITIES:  No cyanosis, clubbing, trace pitting edema.  Grip appears  to be slightly diminished but symmetrical at about a 5-/5, but more  subjective.  Flexion and extension are normal and intact.  Lower  extremities, he has negative Babinski sign.  Sensation appears to be  intact, and he has flexion and extension at the hip, knee, and ankle  intact.   LABORATORY:  White count 9.1, H&H 15.6 and 45 with MCV 92, platelet  count 211,000, 72% shift.  Urinalysis negative.  Sodium 140, potassium  4.2, chloride 106, bicarb not done, BUN 16, creatinine 1, glucose 117.  His MRA of head shows again nonhemorrhagic moderate-sized infarct,  posterior left lobe, extending to mid to posterior left corona radiata,  old small infarct, small vessel disease.  He also has some  slight  narrowing of the proximal right in-dominant artery, left common carotid  artery and left subclavian artery maybe related to artifact and  narrowing, but no evidence of hemodynamically significant stenosis  involving the carotid bifurcation is noted.  However, this is given with  movement.   ASSESSMENT AND PLAN:  1. Acute cerebrovascular accident.  Will be neuro consult, Doppler,      lipids, echo, and homocystine level, check swallowing evaluation,      PT/OT evaluation.  2. Altered mental status, thought to be secondary to #1.  Hydrate.      Continue to follow.  3. Tobacco abuse.  Nicotine patch.  4. Hyperlipidemia.  Will recheck in the morning.      Hollice Espy, M.D.  Electronically Signed     SKK/MEDQ  D:  04/27/2007  T:  04/27/2007  Job:  [id number]   cc:   Lavonda Jumbo, M.D.

## 2010-06-03 NOTE — Discharge Summary (Signed)
NAMECOPELAND, NEISEN       ACCOUNT NO.:  [id number]   MEDICAL RECORD NO.:  [id number]          PATIENT TYPE:  INP   LOCATION:  3036                         FACILITY:  [facility]   PHYSICIAN:  Ramiro Harvest, MD    DATE OF BIRTH:  11-06-1932   DATE OF ADMISSION:  04/27/2007  DATE OF DISCHARGE:  05/05/2007                               DISCHARGE SUMMARY   ADDENDUM   PRIMARY CARE PHYSICIAN:  Lavonda Jumbo, M.D. of [facility].   DISCHARGE DIAGNOSIS:  As per prior discharge summary.   DISCHARGE MEDICATIONS:  1. Lisinopril 10 mg p.o. daily.  2. Nicotine patch 21 mg topically daily.  3. Zetia 10 mg daily.  4. Aspirin 325 mg p.o. daily.  5. Colace 100 mg p.o. daily.   DISPOSITION AND FOLLOWUP:  The patient will be discharged home with home  health PT and OT.  The patient will continue dysphagia 2 diet.  The  patient will need to schedule a followup appointment with Dr. Pearlean Brownie in 4  weeks of [facility] as stated in the prior discharge summary.  Also, the patient is to follow up with PCP in the next 2 to 3 weeks, and  on followup, a basic metabolic profile needs to be checked to follow up  on the patient's electrolytes and renal function and also to follow up  on the patient's blood pressure medications.   HOSPITAL COURSE:  This covers the period between May 03, 2007, and  May 05, 2007.  The patient essentially remained stable.  The patient  was in the hospital awaiting placement for a skilled nursing facility  versus inpatient facility.  It was felt that the patient was too high of  a level for inpatient facility and was denied.  The patient's insurance  also denied a skilled nursing facility as well, and as such, the patient  will be discharged home with home health PT and OT.  The rest of the  patient's medical issues remained stable throughout the hospitalization.  The patient was in stable and improved condition by a day of discharge.   PHYSICAL EXAMINATION:   VITAL SIGNS:  On the day of discharge,  temperature was 97.0, pulse of 55, blood pressure 154/73, respiratory  rate 20, and saturating 94% on room air.   DISCHARGE LABS:  Sodium 142, potassium 3.3, chloride 105, bicarbonate  27, BUN 12, creatinine 0.73, glucose of 99, and calcium of 9.3.   DISPOSITION:  The patient's potassium was repleted prior to discharge,  and the patient will be discharged home in a stable and improved  condition.   It was a pleasure taking care of Mr. Laverne Klugh.      Ramiro Harvest, MD  Electronically Signed     DT/MEDQ  D:  05/05/2007  T:  05/06/2007  Job:  [id number]   cc:   Lavonda Jumbo, M.D.  Pramod P. Pearlean Brownie, MD

## 2010-06-03 NOTE — Discharge Summary (Signed)
Cole Cunningham, [name]       ACCOUNT NO.:  [id number]   MEDICAL RECORD NO.:  [id number]          PATIENT TYPE:  INP   LOCATION:  3036                         FACILITY:  [facility]   PHYSICIAN:  Hollice Espy, M.D.DATE OF BIRTH:  1932/06/07   DATE OF ADMISSION:  04/27/2007  DATE OF DISCHARGE:  05/02/2007                               DISCHARGE SUMMARY   ANTICIPATED DATE OF DISCHARGE:  May 02, 2007.   ATTENDING PHYSICIAN:  Hollice Espy, M.D.   PRIMARY CARE PHYSICIAN:  Lavonda Jumbo, M.D.   DISCHARGE DIAGNOSES:  1. Acute nonhemorrhagic CVA in the posterior left lenticular nucleus      extending to the mid posterior left corona radiata.  2. Altered mental status with cognition impairment secondary to #1.  3. Dysphagia secondary to #1.  4. Newly diagnosed hypertension felt to be contributing factor to #1.  5. Hyperlipidemia.  6. Tobacco abuse.  7. Obesity.   DISCHARGE MEDICATIONS:  1. Discharge medications for this patient are as follows, lisinopril 5      mg daily.  2. Nicotine patch 21 mg topically daily.  3. Aspirin 325 p.o. daily.  4. Zetia 10 p.o. daily.   DIET:  The patient is being put on a heart healthy dysphagia 2 diet with  nectar thick liquid, full supervision and aspiration precaution.   ACTIVITY:  As per skilled nursing facility.   DISPOSITION:  From initial presentation is slight improvement.   FOLLOWUP:  The patient will follow up with Dr. Pearlean Brownie of neurology at  [facility] in 4 weeks.  He will follow up with Dr. Joselyn Arrow in 4 weeks.   HOSPITAL COURSE:  The patient is a 77 year old white male with past  medical history of tobacco abuse and obesity who was brought in for  disorientation and speech difficulties noted 1 day prior to admission.  He had a CT scan of the head which showed an infarct age indeterminate  area and an MRI done confirmed a left hemispheric acute infarct as well  as presence of previous older smaller infarct.   Neurology, Dr. Nash Shearer,  was consulted to evaluated the patient.  He is felt not to be a t-PA  candidate as symptoms were greater than 3 hours out.  The patient was  started on aspirin.  He initially failed a bedside swallowing evaluation  so he was made n.p.o. until a speech therapy evaluation could be done.  In regards to his CVA the patient had lipid profile checked.  He was  found to have an LDL around 125 with a new goal of less than 70 and HDL  of 38.  He is intolerant to statins as he has had previous  rhabdomyolysis report and he was started on Zetia.  He noted to have in  evaluation not any true physical defects but he was found to have some  cognitive impairments and coordination issues and it was recommended  that he either go home with 24 hour supervision and home health PT/OT or  a short term SNF.  Because the family was not able to provide 24 hour  continuous care initially the  plan will be for the patient to be  discharged to a skilled nursing facility as soon as a bed offer is made  and accepted starting May 02, 2007.   In regards to the patient's dysphagia he was evaluated by speech therapy  and found to have significant impairment after a modified barium  swallow.  They recommended the patient for now be on a dysphagia 2  nectar thick liquid with full supervision and aspiration precautions.  The patient's homocysteine level returned back within normal limits.   In regards to his nicotine addiction he was put on a nicotine patch and  is intolerant of this.   In regards to his hypertension initially his blood pressure was elevated  and no new medication was started in terms of concerns about  hypoperfusion.  However, over time his blood pressure has continued to  stay elevated and eventually he was started on lisinopril 5 mg p.o.  daily which has shown some improvement with blood pressure dropping down  into the 130s to 140s.  We will check prior to discharge a BMET  to  ensure that he has no problems with acute renal failure or hyperkalemia  and then the patient will have further followup by Dr. Lavonda Jumbo, his  PCP for this.   An echocardiogram was done pending stroke workup as well as carotid  Doppler.  Carotid Doppler showed nonsignificant bilateral stenosis and  his Doppler showed preserved ejection fraction with no signs of valve  disorder, however, there was some motion artifact noted.  The patient  otherwise is doing well.  His cognitive impairment will determine his  ability to fully participate in rehab.      Hollice Espy, M.D.  Electronically Signed     SKK/MEDQ  D:  05/01/2007  T:  05/01/2007  Job:  [id number]   cc:   Lavonda Jumbo, M.D.  Pramod P. Pearlean Brownie, MD

## 2010-10-14 LAB — BASIC METABOLIC PANEL WITH GFR
BUN: 19
CO2: 24
Calcium: 9.3
Chloride: 104
Creatinine, Ser: 0.72
GFR calc non Af Amer: >60
Glucose, Bld: 104 — ABNORMAL HIGH
Potassium: 3.5
Sodium: 139

## 2010-10-14 LAB — BASIC METABOLIC PANEL
BUN: 12
CO2: 27
Calcium: 9.3
Creatinine, Ser: 0.73
Glucose, Bld: 99
Sodium: 142

## 2010-10-14 LAB — CBC
HCT: 40.7
HCT: 45.3
Hemoglobin: 14.1
Hemoglobin: 15.6
MCHC: 34.5
MCHC: 34.7
MCV: 92.6
MCV: 92.9
Platelets: 190
Platelets: 211
RBC: 4.38
RBC: 4.9
RDW: 13.7
RDW: 14.3
WBC: 10.6 — ABNORMAL HIGH
WBC: 9.1

## 2010-10-14 LAB — POCT I-STAT, CHEM 8
BUN: 16
Calcium, Ion: 1.13
Chloride: 106
Creatinine, Ser: 1
Glucose, Bld: 117 — ABNORMAL HIGH
HCT: 48
Hemoglobin: 16.3
Potassium: 4.3
Sodium: 140
TCO2: 27

## 2010-10-14 LAB — DIFFERENTIAL
Basophils Absolute: 0
Basophils Relative: 0
Eosinophils Absolute: 0
Eosinophils Relative: 0
Lymphocytes Relative: 21
Lymphs Abs: 1.9
Monocytes Absolute: 0.5
Monocytes Relative: 6
Neutro Abs: 6.6
Neutrophils Relative %: 73

## 2010-10-14 LAB — URINALYSIS, ROUTINE W REFLEX MICROSCOPIC
Glucose, UA: NEGATIVE
Ketones, ur: NEGATIVE
Nitrite: NEGATIVE
Protein, ur: NEGATIVE
pH: 6

## 2010-10-14 LAB — HOMOCYSTEINE: Homocysteine: 5.9

## 2010-10-14 LAB — LIPID PANEL: Cholesterol: 207 — ABNORMAL HIGH

## 2010-10-17 LAB — COMPREHENSIVE METABOLIC PANEL
ALT: 32
AST: 27
Albumin: 3.7
Alkaline Phosphatase: 91
BUN: 20
CO2: 26
Calcium: 9.2
Chloride: 106
Creatinine, Ser: 0.83
GFR calc Af Amer: >60
GFR calc non Af Amer: >60
Glucose, Bld: 106 — ABNORMAL HIGH
Potassium: 3.7
Sodium: 141
Total Bilirubin: 0.7
Total Protein: 6.7

## 2010-10-17 LAB — URINALYSIS, ROUTINE W REFLEX MICROSCOPIC
Bilirubin Urine: NEGATIVE
Glucose, UA: NEGATIVE
Ketones, ur: NEGATIVE
Nitrite: NEGATIVE
Protein, ur: 30 — AB
Specific Gravity, Urine: 1.024
Urobilinogen, UA: 0.2
pH: 5.5

## 2010-10-17 LAB — DIFFERENTIAL
Basophils Absolute: 0.1
Basophils Relative: 1
Eosinophils Absolute: 0
Eosinophils Relative: 0
Lymphocytes Relative: 12
Lymphs Abs: 1.4
Monocytes Absolute: 0.4
Monocytes Relative: 3
Neutro Abs: 9.6 — ABNORMAL HIGH
Neutrophils Relative %: 84 — ABNORMAL HIGH

## 2010-10-17 LAB — CBC
HCT: 39.9
Hemoglobin: 13.3
MCHC: 33.5
MCV: 94.3
Platelets: 208
RBC: 4.23
RDW: 13.2
WBC: 11.5 — ABNORMAL HIGH

## 2010-10-17 LAB — URINE MICROSCOPIC-ADD ON

## 2010-10-17 LAB — URINE CULTURE

## 2011-12-30 ENCOUNTER — Encounter (HOSPITAL_COMMUNITY): Payer: Self-pay | Admitting: Emergency Medicine

## 2011-12-30 ENCOUNTER — Inpatient Hospital Stay (HOSPITAL_COMMUNITY)
Admission: EM | Admit: 2011-12-30 | Discharge: 2012-01-13 | DRG: 332 | Disposition: A | Discharge status: Home / Self Care | Payer: Medicare Other | Attending: Internal Medicine | Admitting: Internal Medicine

## 2011-12-30 DIAGNOSIS — K6289 Other specified diseases of anus and rectum: Secondary | ICD-10-CM | POA: Diagnosis present

## 2011-12-30 DIAGNOSIS — D649 Anemia, unspecified: Secondary | ICD-10-CM

## 2011-12-30 DIAGNOSIS — G20 Parkinson's disease: Secondary | ICD-10-CM | POA: Diagnosis present

## 2011-12-30 DIAGNOSIS — K922 Gastrointestinal hemorrhage, unspecified: Secondary | ICD-10-CM

## 2011-12-30 DIAGNOSIS — D509 Iron deficiency anemia, unspecified: Secondary | ICD-10-CM | POA: Diagnosis present

## 2011-12-30 DIAGNOSIS — C189 Malignant neoplasm of colon, unspecified: Secondary | ICD-10-CM

## 2011-12-30 DIAGNOSIS — D72829 Elevated white blood cell count, unspecified: Secondary | ICD-10-CM

## 2011-12-30 DIAGNOSIS — Z8673 Personal history of transient ischemic attack (TIA), and cerebral infarction without residual deficits: Secondary | ICD-10-CM

## 2011-12-30 DIAGNOSIS — Z87891 Personal history of nicotine dependence: Secondary | ICD-10-CM

## 2011-12-30 DIAGNOSIS — G20A1 Parkinson's disease without dyskinesia, without mention of fluctuations: Secondary | ICD-10-CM

## 2011-12-30 DIAGNOSIS — J189 Pneumonia, unspecified organism: Secondary | ICD-10-CM | POA: Diagnosis present

## 2011-12-30 DIAGNOSIS — E876 Hypokalemia: Secondary | ICD-10-CM

## 2011-12-30 DIAGNOSIS — C218 Malignant neoplasm of overlapping sites of rectum, anus and anal canal: Principal | ICD-10-CM | POA: Diagnosis present

## 2011-12-30 DIAGNOSIS — Z01812 Encounter for preprocedural laboratory examination: Secondary | ICD-10-CM

## 2011-12-30 HISTORY — DX: Parkinson's disease without dyskinesia, without mention of fluctuations: G20.A1

## 2011-12-30 HISTORY — DX: Fracture of unspecified part of neck of unspecified femur, initial encounter for closed fracture: S72.009A

## 2011-12-30 HISTORY — DX: Parkinson's disease: G20

## 2011-12-30 HISTORY — DX: Cerebral infarction, unspecified: I63.9

## 2011-12-30 LAB — CBC
HCT: 21.1 % — ABNORMAL LOW (ref 39.0–52.0)
Hemoglobin: 5.2 g/dL — CL (ref 13.0–17.0)
MCH: 15.1 pg — ABNORMAL LOW (ref 26.0–34.0)
MCHC: 24.6 g/dL — ABNORMAL LOW (ref 30.0–36.0)
MCV: 61.3 fL — ABNORMAL LOW (ref 78.0–100.0)
RDW: 21 % — ABNORMAL HIGH (ref 11.5–15.5)

## 2011-12-30 LAB — PROTIME-INR: Prothrombin Time: 15.2 seconds (ref 11.6–15.2)

## 2011-12-30 LAB — COMPREHENSIVE METABOLIC PANEL
AST: 19 U/L (ref 0–37)
Albumin: 2.9 g/dL — ABNORMAL LOW (ref 3.5–5.2)
Alkaline Phosphatase: 108 U/L (ref 39–117)
Chloride: 105 mEq/L (ref 96–112)
Potassium: 2.9 mEq/L — ABNORMAL LOW (ref 3.5–5.1)
Sodium: 140 mEq/L (ref 135–145)
Total Bilirubin: 0.7 mg/dL (ref 0.3–1.2)

## 2011-12-30 LAB — LACTATE DEHYDROGENASE: LDH: 213 U/L (ref 94–250)

## 2011-12-30 LAB — PREPARE RBC (CROSSMATCH)

## 2011-12-30 MED ORDER — ONDANSETRON HCL 4 MG PO TABS
4.0000 mg | ORAL_TABLET | Freq: Four times a day (QID) | ORAL | Status: DC | PRN
  Filled 2011-12-30: qty 1

## 2011-12-30 MED ORDER — POTASSIUM CHLORIDE 10 MEQ/100ML IV SOLN
10.0000 meq | INTRAVENOUS | Status: AC
  Administered 2011-12-31 (×4): 10 meq via INTRAVENOUS
  Filled 2011-12-30 (×4): qty 100

## 2011-12-30 MED ORDER — ACETAMINOPHEN 325 MG PO TABS: 650.0000 mg | ORAL_TABLET | Freq: Four times a day (QID) | ORAL | Status: DC | PRN

## 2011-12-30 MED ORDER — POTASSIUM CHLORIDE 20 MEQ/15ML (10%) PO LIQD
40.0000 meq | Freq: Once | ORAL | Status: AC
  Administered 2011-12-30: 40 meq via ORAL
  Filled 2011-12-30: qty 30

## 2011-12-30 MED ORDER — SODIUM CHLORIDE 0.9 % IV SOLN
INTRAVENOUS | Status: DC
  Administered 2011-12-31: 10 mL/h via INTRAVENOUS
  Administered 2012-01-01: 500 mL via INTRAVENOUS
  Administered 2012-01-05: 10:00:00 via INTRAVENOUS

## 2011-12-30 MED ORDER — ACETAMINOPHEN 650 MG RE SUPP: 650.0000 mg | Freq: Four times a day (QID) | RECTAL | Status: DC | PRN

## 2011-12-30 MED ORDER — ONDANSETRON HCL 4 MG/2ML IJ SOLN: 4.0000 mg | Freq: Four times a day (QID) | INTRAMUSCULAR | Status: DC | PRN

## 2011-12-30 MED ORDER — PANTOPRAZOLE SODIUM 40 MG IV SOLR
40.0000 mg | Freq: Two times a day (BID) | INTRAVENOUS | Status: DC
  Administered 2011-12-31 – 2012-01-05 (×12): 40 mg via INTRAVENOUS
  Filled 2011-12-30 (×17): qty 40

## 2011-12-30 MED ORDER — SODIUM CHLORIDE 0.9 % IJ SOLN
3.0000 mL | Freq: Two times a day (BID) | INTRAMUSCULAR | Status: DC
  Administered 2011-12-31 – 2012-01-05 (×8): 3 mL via INTRAVENOUS

## 2011-12-30 MED ORDER — POTASSIUM CHLORIDE CRYS ER 20 MEQ PO TBCR: 40.0000 meq | EXTENDED_RELEASE_TABLET | Freq: Once | ORAL | Status: DC

## 2011-12-30 NOTE — Progress Notes (Signed)
Pt arrived via stretcher to [phone] with wife at bedside.  Pt has dysphagia and communicates through wife.  Denied any complaints.  VSS.  Oriented x4.  Oriented pt and wife to room and unit visitor guidelines.  Safety video viewed.  Instructed to use call bell for assistance.

## 2011-12-30 NOTE — ED Notes (Signed)
Spoke to MD about switching potassium to liquid per pt request.

## 2011-12-30 NOTE — ED Notes (Signed)
MD at bedside. 

## 2011-12-30 NOTE — H&P (Signed)
Cole Cunningham is an 78 y.o. male.   Patient was seen and examined on December 30, 2011. PCP - Dr. Lurena Cunningham at [facility]. Chief Complaint: Weakness. HPI: 78 year old male with history of Parkinson's disease was found to be increasingly weak lethargic over the last one week as witnessed by patient's wife. Patient was taken to her PCPs office and was found to have a hemoglobin on 5 and was referred to the ER. In the ER repeat hemoglobin was around 5 and patient was admitted from a further management. Patient's wife did not notice any blood in the stool or black stools and patient does not take any NSAIDs. Patient's stool for occult blood was positive and patient has been admitted for further management. Patient is presently hemodynamically stable.  Past Medical History  Diagnosis Date  . Stroke   . Hip fracture   . Parkinson's disease     Past Surgical History  Procedure Date  . Fracture surgery     Family History  Problem Relation Age of Onset  . Other Neg Hx    Social History:  reports that he has quit smoking. He does not have any smokeless tobacco history on file. He reports that he does not drink alcohol or use illicit drugs.  Allergies: No Known Allergies  Medications Prior to Admission  Medication Sig Dispense Refill  . aspirin EC 81 MG tablet Take 81 mg by mouth daily.      . Multiple Vitamin (MULTIVITAMIN WITH MINERALS) TABS Take 1 tablet by mouth daily.        Results for orders placed during the hospital encounter of 12/30/11 (from the past 48 hour(s))  COMPREHENSIVE METABOLIC PANEL     Status: Abnormal   Collection Time   12/30/11  7:43 PM      Component Value Range Comment   Sodium 140  135 - 145 mEq/L    Potassium 2.9 (*) 3.5 - 5.1 mEq/L    Chloride 105  96 - 112 mEq/L    CO2 26  19 - 32 mEq/L    Glucose, Bld 94  70 - 99 mg/dL    BUN 13  6 - 23 mg/dL    Creatinine, Ser 1.61  0.50 - 1.35 mg/dL    Calcium 8.4  8.4 - 09.6 mg/dL    Total  Protein 5.8 (*) 6.0 - 8.3 g/dL    Albumin 2.9 (*) 3.5 - 5.2 g/dL    AST 19  0 - 37 U/L    ALT 13  0 - 53 U/L    Alkaline Phosphatase 108  39 - 117 U/L    Total Bilirubin 0.7  0.3 - 1.2 mg/dL    GFR calc non Af Amer >90  >90 mL/min    GFR calc Af Amer >90  >90 mL/min   APTT     Status: Normal   Collection Time   12/30/11  7:43 PM      Component Value Range Comment   aPTT 32  24 - 37 seconds   PROTIME-INR     Status: Normal   Collection Time   12/30/11  7:43 PM      Component Value Range Comment   Prothrombin Time 15.2  11.6 - 15.2 seconds    INR 1.22  0.00 - 1.49   LACTIC ACID, PLASMA     Status: Normal   Collection Time   12/30/11  7:57 PM      Component Value Range Comment   Lactic  Acid, Venous 2.0  0.5 - 2.2 mmol/L   SEDIMENTATION RATE     Status: Normal   Collection Time   12/30/11  8:00 PM      Component Value Range Comment   Sed Rate 4  0 - 16 mm/hr   TYPE AND SCREEN     Status: Normal (Preliminary result)   Collection Time   12/30/11  8:10 PM      Component Value Range Comment   ABO/RH(D) A POS      Antibody Screen NEG      Sample Expiration 01/02/2012      Unit Number [id number]      Blood Component Type RBC LR PHER2      Unit division 00      Status of Unit ALLOCATED      Transfusion Status OK TO TRANSFUSE      Crossmatch Result Compatible      Unit Number [id number]      Blood Component Type RBC LR PHER2      Unit division 00      Status of Unit ISSUED      Transfusion Status OK TO TRANSFUSE      Crossmatch Result Compatible     ABO/RH     Status: Normal   Collection Time   12/30/11  8:10 PM      Component Value Range Comment   ABO/RH(D) A POS     OCCULT BLOOD, POC DEVICE     Status: Normal   Collection Time   12/30/11  8:25 PM      Component Value Range Comment   Fecal Occult Bld POSITIVE     PREPARE RBC (CROSSMATCH)     Status: Normal   Collection Time   12/30/11 10:00 PM      Component Value Range Comment   Order Confirmation ORDER  PROCESSED BY BLOOD BANK      No results found.  Review of Systems  Constitutional: Positive for malaise/fatigue.  HENT: Negative.   Eyes: Negative.   Respiratory: Negative.   Cardiovascular: Negative.   Gastrointestinal: Negative.   Genitourinary: Negative.   Musculoskeletal: Negative.   Skin: Negative.   Neurological: Positive for weakness.  Endo/Heme/Allergies: Negative.   Psychiatric/Behavioral: Negative.     Blood pressure 140/69, pulse 76, temperature 98.6 F (37 C), temperature source Oral, resp. rate 20, height 5\' 8"  (1.727 m), weight 62.8 kg (138 lb 7.2 oz), SpO2 99.00%. Physical Exam  Constitutional: He appears well-nourished. No distress.  HENT:  Head: Normocephalic and atraumatic.  Right Ear: External ear normal.  Left Ear: External ear normal.  Nose: Nose normal.  Mouth/Throat: Oropharynx is clear and moist. No oropharyngeal exudate.  Eyes: Pupils are equal, round, and reactive to light. Right eye exhibits no discharge. Left eye exhibits no discharge. No scleral icterus.  Neck: Normal range of motion. Neck supple.  Cardiovascular: Normal rate and regular rhythm.   Respiratory: Effort normal and breath sounds normal. No respiratory distress. He has no wheezes. He has no rales.  GI: Soft. Bowel sounds are normal. He exhibits no distension. There is no tenderness. There is no rebound.  Musculoskeletal: He exhibits no edema and no tenderness.  Neurological: He is alert.       Oriented to name and place. Moves all extremities.  Skin: He is not diaphoretic. There is pallor.     Assessment/Plan #1. Symptomatic severe anemia secondary to GI bleed - GI bleed is probably chronic. Patient has been placed on clear liquid  diet and 2 units of packed red blood cell has been ordered for transfusion. Consult GI in a.m. #2. Hypokalemia - probably from poor oral intake. Patient's wife states that patient has not been eating well for last few days. Replaced through IV and orally  recheck metabolic panel in a.m. #3. History of Parkinson's disease - continue home medications.  CODE STATUS - full code.  Cole Cunningham N. 12/30/2011, 11:35 PM

## 2011-12-30 NOTE — ED Notes (Signed)
Pt has been feeling tired and not wanting to get out of bed for past week.  Today pt was seen by his MD had labs drawn and given flu shot.  Tonight pt was called and told to come to ED ref. Hgb 5.6 and Hct.20.  Pt denies any pain, nausea, vomiting or blood in stools. Skin warm and dry color pale.

## 2011-12-30 NOTE — ED Provider Notes (Signed)
History     CSN: [id number]  Arrival date & time 12/30/11  6644   First MD Initiated Contact with Patient 12/30/11 1950      Chief Complaint  Patient presents with  . Abnormal Lab    (Consider location/radiation/quality/duration/timing/severity/associated sxs/prior treatment) HPI Comments: Called in for anemia of 5.2 today  Patient is a 78 y.o. male presenting with general illness.  Illness  The current episode started more than 1 week ago. The onset was gradual. The problem occurs continuously. The problem has been unchanged. The problem is severe. Nothing relieves the symptoms. Nothing aggravates the symptoms. Pertinent negatives include no fever, no abdominal pain, no vomiting and no cough.    Past Medical History  Diagnosis Date  . Stroke   . Hip fracture     Past Surgical History  Procedure Date  . Fracture surgery     No family history on file.  History  Substance Use Topics  . Smoking status: Former Games developer  . Smokeless tobacco: Not on file  . Alcohol Use: No      Review of Systems  Constitutional: Negative for fever and chills.  Respiratory: Negative for cough and shortness of breath.   Cardiovascular: Negative for chest pain and leg swelling.  Gastrointestinal: Negative for vomiting and abdominal pain.  All other systems reviewed and are negative.    Allergies  Review of patient's allergies indicates not on file.  Home Medications  No current outpatient prescriptions on file.  BP 131/44  Pulse 71  Temp 98.5 F (36.9 C) (Oral)  Resp 16  SpO2 100%  Physical Exam  Nursing note and vitals reviewed. Constitutional: He is oriented to person, place, and time. He appears well-developed and well-nourished. No distress.  HENT:  Head: Normocephalic and atraumatic.  Mouth/Throat: No oropharyngeal exudate.  Eyes: EOM are normal. Pupils are equal, round, and reactive to light.       Conjunctival pallor present  Neck: Normal range of motion. Neck  supple.  Cardiovascular: Normal rate and regular rhythm.  Exam reveals no friction rub.   No murmur heard. Pulmonary/Chest: Effort normal and breath sounds normal. No respiratory distress. He has no wheezes. He has no rales.  Abdominal: He exhibits no distension. There is no tenderness. There is no rebound.  Musculoskeletal: Normal range of motion. He exhibits edema (1+bilaterally).  Neurological: He is alert and oriented to person, place, and time.  Skin: No rash noted. He is not diaphoretic.    ED Course  Procedures (including critical care time)  Labs Reviewed  COMPREHENSIVE METABOLIC PANEL - Abnormal; Notable for the following:    Potassium 2.9 (*)     Total Protein 5.8 (*)     Albumin 2.9 (*)     All other components within normal limits  APTT  PROTIME-INR  LACTIC ACID, PLASMA  SEDIMENTATION RATE  TYPE AND SCREEN  OCCULT BLOOD, POC DEVICE  ABO/RH  PREPARE RBC (CROSSMATCH)  LACTATE DEHYDROGENASE  HAPTOGLOBIN  FERRITIN  IRON AND TIBC  C-REACTIVE PROTEIN   No results found.   Date: 12/30/2011  Rate: 72  Rhythm: normal sinus rhythm  QRS Axis: normal  Intervals: normal  ST/T Wave abnormalities: normal  Conduction Disutrbances:none  Narrative Interpretation:   Old EKG Reviewed: none available    Impression: 1. Anemia 2. Weakness    MDM   71M with hx of stroke presents with severe anemia. Went to the PCP today for weakness/fatigue for past week. Called tonight with a critical Hgb of 5.6,  Hct of 20. Came here for admission. Patient here denies chest pain, SOB, fever, recent illness. AFVSS, no tachycardia or hypotension. Patient pale, conjunctival pallor present.  Hemoccult positive. Labs ordered. Plan to admit.           Elwin Mocha, MD 12/30/11 [phone]

## 2011-12-31 LAB — CBC
HCT: 24.6 % — ABNORMAL LOW (ref 39.0–52.0)
MCHC: 27.6 g/dL — ABNORMAL LOW (ref 30.0–36.0)
RDW: 24.6 % — ABNORMAL HIGH (ref 11.5–15.5)
WBC: 4.6 10*3/uL (ref 4.0–10.5)

## 2011-12-31 LAB — COMPREHENSIVE METABOLIC PANEL
ALT: 10 U/L (ref 0–53)
AST: 21 U/L (ref 0–37)
Albumin: 2.6 g/dL — ABNORMAL LOW (ref 3.5–5.2)
Alkaline Phosphatase: 98 U/L (ref 39–117)
BUN: 11 mg/dL (ref 6–23)
Chloride: 105 mEq/L (ref 96–112)
Potassium: 4.3 mEq/L (ref 3.5–5.1)
Sodium: 137 mEq/L (ref 135–145)
Total Bilirubin: 2.9 mg/dL — ABNORMAL HIGH (ref 0.3–1.2)
Total Protein: 5.3 g/dL — ABNORMAL LOW (ref 6.0–8.3)

## 2011-12-31 LAB — HEMOGLOBIN AND HEMATOCRIT, BLOOD
HCT: 25.4 % — ABNORMAL LOW (ref 39.0–52.0)
Hemoglobin: 7.2 g/dL — ABNORMAL LOW (ref 13.0–17.0)

## 2011-12-31 LAB — FERRITIN: Ferritin: 4 ng/mL — ABNORMAL LOW (ref 22–322)

## 2011-12-31 LAB — HAPTOGLOBIN: Haptoglobin: 81 mg/dL (ref 45–215)

## 2011-12-31 LAB — IRON AND TIBC

## 2011-12-31 MED ORDER — SODIUM CHLORIDE 0.9 % IV SOLN: INTRAVENOUS | Status: DC

## 2011-12-31 NOTE — Progress Notes (Signed)
INITIAL NUTRITION ASSESSMENT  DOCUMENTATION CODES Per approved criteria  -Not Applicable    INTERVENTION: 1. Magic cup TID between meals, each supplement provides 290 kcal and 9 grams of protein. Once diet advanced 2. RD will continue to follow    NUTRITION DIAGNOSIS: Predicted sub-optimal intake related to change in environment as evidenced by hx of weight loss.   Goal: PO intake to meet>/=90% estimated nutrition needs  Monitor:  PO intake, weight, labs, I/O's  Reason for Assessment: MST (Malnutrition Screening Tool)   78 y.o. male  Admitting Dx: Anemia  ASSESSMENT: Spoke with pt's wife, states that pt lost about 15 lbs in the spring after an illness and has not been able to gain the weight back despite eating well. With hx of parkinson's may have increased energy expenditure, did not notice any tremor or increased activity at time of RD visit.  Does not like Ensure or other shakes. Does like ice cream, will add Magic cup one diet is advanced.   Height: Ht Readings from Last 1 Encounters:  12/30/11 5\' 8"  (1.727 m)   Weight: Wt Readings from Last 1 Encounters:  12/30/11 138 lb 7.2 oz (62.8 kg)   Ideal Body Weight: 70 kg   % Ideal Body Weight: 89%  Wt Readings from Last 10 Encounters:  12/30/11 138 lb 7.2 oz (62.8 kg)   Usual Body Weight: 153 lbs   % Usual Body Weight: 90%  BMI:  Body mass index is 21.05 kg/(m^2). WNL   Estimated Nutritional Needs: Kcal: 1500-1750 Protein: 60-70 gm  Fluid: 1.5-1.7 L   Skin: no breakdown noted. Mild pitting edema to BLE   Diet Order: NPO  EDUCATION NEEDS: -No education needs identified at this time   Intake/Output Summary (Last 24 hours) at 12/31/11 1542 Last data filed at 12/31/11 1302  Gross per 24 hour  Intake   1330 ml  Output    400 ml  Net    930 ml   Last BM: not documented    Labs:   Lab 12/31/11 0555 12/30/11 1943  NA 137 140  K 4.3 2.9*  CL 105 105  CO2 25 26  BUN 11 13  CREATININE 0.58 0.66   CALCIUM 8.1* 8.4  MG -- --  PHOS -- --  GLUCOSE 81 94    CBG (last 3)  No results found for this basename: GLUCAP:3 in the last 72 hours  Scheduled Meds:   . pantoprazole (PROTONIX) IV  40 mg Intravenous Q12H  . [COMPLETED] potassium chloride  10 mEq Intravenous Q1 Hr x 4  . [COMPLETED] potassium chloride  40 mEq Oral Once  . sodium chloride  3 mL Intravenous Q12H  . [DISCONTINUED] potassium chloride SA  40 mEq Oral Once   Continuous Infusions:   . sodium chloride 10 mL/hr at 12/31/11 0500  . sodium chloride      Past Medical History  Diagnosis Date  . Stroke   . Hip fracture   . Parkinson's disease     Past Surgical History  Procedure Date  . Fracture surgery       Clarene Duke RD, LDN Pager [phone] After Hours pager [phone]

## 2011-12-31 NOTE — Care Management Note (Addendum)
    Page 1 of 2   01/12/2012     3:46:22 PM   CARE MANAGEMENT NOTE 01/12/2012  Patient:  Cole Cunningham, Cole Cunningham   Account Number:  [id number]  Date Initiated:  12/31/2011  Documentation initiated by:  Letha Cape  Subjective/Objective Assessment:   dx anemia  admit-lives with spouse. pt has a rolling walker , hosp bed 2 lift chairs,  bsc , wheelchair and power chair.     Action/Plan:   pt eval- hhpt   Anticipated DC Date:  01/13/2012   Anticipated DC Plan:  HOME W HOME HEALTH SERVICES      DC Planning Services  CM consult      [facility] Choice  HOME HEALTH   Choice offered to / List presented to:  C-3 Spouse        HH arranged  HH-1 RN  HH-2 PT      Status of service:  Completed, signed off Medicare Important Message given?   (If response is "NO", the following Medicare IM given date fields will be blank) Date Medicare IM given:   Date Additional Medicare IM given:    Discharge Disposition:  HOME W HOME HEALTH SERVICES  Per UR Regulation:  Reviewed for med. necessity/level of care/duration of stay  If discussed at Long Length of Stay Meetings, dates discussed:   01/07/2012    Comments:  01/12/12 15:45 Letha Cape RN, BSN [phone] patient for dc on 12/25 with [facility] services, Hilda Lias with [facility] notified on 12/24.  01/08/12 11:52 Letha Cape RN, BSN [phone] spoke with patient's wife, she states she would like to work with [facility], referral given to [facility], Lupita Leash notified.  Soc will begin 24-48 hrs post discharge.  Patient will most likely dc Mon or Tues.  01/08/12 10:29 Letha Cape RN, BSN [phone] patient is s/p resection, now patient has pot op ileus. Will advance to sips of clears today, if tolerates then will advance diet over next couple of days.  12/31/11 11:52 Letha Cape RN,BSN [phone] patient lives with spouse, patient states he is independent at home, he has a rolling walker, a bsc, and a cane. Patient has medication coverage and transportation at  discharge.   NCM will continue to follow for dc needs.

## 2011-12-31 NOTE — Consult Note (Signed)
[facility] Gastroenterology Consultation Note  Referring Provider: Dr. Midge Minium [facility]) Primary Care Physician:  Claude Manges, FNP Primary Gastroenterologist:  Dr. Carman Ching  Reason for Consultation:  Iron-deficiency anemia  HPI: Cole Cunningham is a 78 y.o. male who was admitted for, and we've been asked to see because of, anemia.  For the past several weeks, patient has had progressive fatigue and lethargy.  Denies any nausea, vomiting, change in bowel habits.  Has intermittent history dysphagia per chart, but denies this upon direct questioning.  Has some intermittent hematochezia, and based on my review of Dr. Randa Evens' outpatient chart, this has been ongoing for the past couple years.  Has lost about 15 lbs slowly over the past few years. He endorses having endoscopy and colonoscopy many years ago, but I can't find any record of this.  Labs showed Hgb 6.8 with microcytosis and iron studies (iron, %saturation, ferritin) consistent with iron deficiency.     Past Medical History  Diagnosis Date  . Stroke   . Hip fracture   . Parkinson's disease     Past Surgical History  Procedure Date  . Fracture surgery     Prior to Admission medications   Medication Sig Start Date End Date Taking? Authorizing Provider  aspirin EC 81 MG tablet Take 81 mg by mouth daily.   Yes Historical Provider, MD  Multiple Vitamin (MULTIVITAMIN WITH MINERALS) TABS Take 1 tablet by mouth daily.   Yes Historical Provider, MD    Current Facility-Administered Medications  Medication Dose Route Frequency Provider Last Rate Last Dose  . 0.9 %  sodium chloride infusion   Intravenous Continuous Eduard Clos, MD 10 mL/hr at 12/31/11 0500    . acetaminophen (TYLENOL) tablet 650 mg  650 mg Oral Q6H PRN Eduard Clos, MD       Or  . acetaminophen (TYLENOL) suppository 650 mg  650 mg Rectal Q6H PRN Eduard Clos, MD      . ondansetron [facility]) tablet 4 mg  4 mg Oral Q6H PRN Eduard Clos, MD       Or  . ondansetron [facility]) injection 4 mg  4 mg Intravenous Q6H PRN Eduard Clos, MD      . pantoprazole (PROTONIX) injection 40 mg  40 mg Intravenous Q12H Eduard Clos, MD   40 mg at 12/31/11 1003  . [COMPLETED] potassium chloride 10 mEq in 100 mL IVPB  10 mEq Intravenous Q1 Hr x 4 Eduard Clos, MD   10 mEq at 12/31/11 0450  . [COMPLETED] potassium chloride 20 MEQ/15ML (10%) liquid 40 mEq  40 mEq Oral Once Loren Racer, MD   40 mEq at 12/30/11 2210  . sodium chloride 0.9 % injection 3 mL  3 mL Intravenous Q12H Eduard Clos, MD   3 mL at 12/31/11 0157  . [DISCONTINUED] potassium chloride SA (K-DUR,KLOR-CON) CR tablet 40 mEq  40 mEq Oral Once Elwin Mocha, MD        Allergies as of 12/30/2011  . (No Known Allergies)    Family History  Problem Relation Age of Onset  . Other Neg Hx     History   Social History  . Marital Status: Married    Spouse Name: N/A    Number of Children: N/A  . Years of Education: N/A   Occupational History  . Not on file.   Social History Main Topics  . Smoking status: Former Games developer  . Smokeless tobacco: Not on file  . Alcohol  Use: No  . Drug Use: No  . Sexually Active:    Other Topics Concern  . Not on file   Social History Narrative  . No narrative on file    Review of Systems: ROS Dr. Toniann Fail 12/30/11 reviewed and I agree Physical Exam: Vital signs in last 24 hours: Temp:  [97.6 F (36.4 C)-98.7 F (37.1 C)] 98.7 F (37.1 C) (12/12 0459) Pulse Rate:  [71-84] 78  (12/12 0459) Resp:  [13-31] 18  (12/12 0459) BP: (116-140)/(35-74) 132/61 mmHg (12/12 0459) SpO2:  [98 %-100 %] 98 % (12/12 0459) Weight:  [62.8 kg (138 lb 7.2 oz)] 62.8 kg (138 lb 7.2 oz) (12/11 2246) Last BM Date: 12/29/11 General:   Alert,  Well-developed, well-nourished, pleasant and cooperative in NAD Head:  Normocephalic and atraumatic. Eyes:  Sclera clear, no icterus.   Conjunctiva pale. Ears:  Normal auditory  acuity. Nose:  No deformity, discharge,  or lesions. Mouth:  No deformity or lesions.  Oropharynx pale. Neck:  Supple; no masses or thyromegaly. Lungs:  Clear throughout to auscultation.   No wheezes, crackles, or rhonchi. No acute distress. Heart:  Regular rate and rhythm; no murmurs, clicks, rubs,  or gallops. Abdomen:  Soft, nontender and nondistended. No masses, hepatosplenomegaly or hernias noted. Normal bowel sounds, without guarding, and without rebound.     Msk:  Symmetrical without gross deformities. Normal posture. Pulses:  Normal pulses noted. Extremities:  Without clubbing or edema. Neurologic:  Alert and  oriented x4;  Bradykinetic with flat facies (Parkinson's) Skin:  Pale.Intact without significant lesions or rashes.. Psych:  Bradykinetic, flat facies   Lab Results:  Basename 12/31/11 0555 12/30/11 2000  WBC 4.6 4.6  HGB 6.8* 5.2*  HCT 24.6* 21.1*  PLT 230 260   BMET  Basename 12/31/11 0555 12/30/11 1943  NA 137 140  K 4.3 2.9*  CL 105 105  CO2 25 26  GLUCOSE 81 94  BUN 11 13  CREATININE 0.58 0.66  CALCIUM 8.1* 8.4   LFT  Basename 12/31/11 0555  PROT 5.3*  ALBUMIN 2.6*  AST 21  ALT 10  ALKPHOS 98  BILITOT 2.9*  BILIDIR --  IBILI --   PT/INR  Basename 12/30/11 1943  LABPROT 15.2  INR 1.22    Studies/Results: No results found.  Impression:  1.  Microcytic anemia. 2.  Hematochezia, mild, intermittent.  This appears to be at least intermittently ongoing for the past couple years.  Unclear if this is playing any role into the patient's anemia.  Plan:  1.  Full liquid diet today, NPO after midnight. 2.  Endoscopy tomorrow. 3.  If endoscopy is unrevealing, will likely then need colonoscopy. 4.  Will follow; thank you for the consult.   LOS: 1 day   Juneau Doughman M  12/31/2011, 12:52 PM

## 2011-12-31 NOTE — Progress Notes (Signed)
[facility] PROGRESS NOTE  Tomer Chalmers Hirschman JXB:[id number] DOB: April 26, 1932 DOA: 12/30/2011 PCP: Claude Manges, FNP  Assessment/Plan: 1. Symptomatic severe anemia secondary to GI bleed - .status post transfusion of 2 units of packed red blood cells, but hemoglobin is still 6.8 and this a.m. we'll recheck and further transfuse if still less than 7 -I. have consulted [facility] for further recommendations #2. Hypokalemia - probably from poor oral intake. Repleted #3. History of Parkinson's disease - continue home medications. PT OT consult     Code Status: FULL Family Communication: Wife at bedside Disposition Plan: Pending PT eval   Consultants:  GI consulted-eval pending  Procedures:  NONE  Antibiotics:  NONE  HPI/Subjective: Patient denies hematemesis, also denies hematochezia  Objective: Filed Vitals:   12/31/11 0100 12/31/11 0200 12/31/11 0240 12/31/11 0459  BP: 132/47 137/45 128/44 132/61  Pulse: 75 75 77 78  Temp: 98.1 F (36.7 C) 97.7 F (36.5 C) 98.2 F (36.8 C) 98.7 F (37.1 C)  TempSrc: Oral Oral Oral Oral  Resp: 20 18 20 18   Height:      Weight:      SpO2:    98%    Intake/Output Summary (Last 24 hours) at 12/31/11 0819 Last data filed at 12/31/11 0500  Gross per 24 hour  Intake   1090 ml  Output    200 ml  Net    890 ml   Filed Weights   12/30/11 2246  Weight: 62.8 kg (138 lb 7.2 oz)    Exam:   General:  He is alert, answers simple questions appropriately but speech and difficult to understand(per wife since his stroke)  Cardiovascular: Regular rate and rhythm, normal S1-S2  Respiratory: Decreased breath sounds at bases, otherwise clear to auscultation  Abdomen: Soft, bowel sounds present nontender and nondistended no organomegaly and no mass palpable  Data Reviewed: Basic Metabolic Panel:  Lab 12/31/11 1308 12/30/11 1943  NA 137 140  K 4.3 2.9*  CL 105 105  CO2 25 26  GLUCOSE 81 94  BUN 11 13  CREATININE 0.58  0.66  CALCIUM 8.1* 8.4  MG -- --  PHOS -- --   Liver Function Tests:  Lab 12/31/11 0555 12/30/11 1943  AST 21 19  ALT 10 13  ALKPHOS 98 108  BILITOT 2.9* 0.7  PROT 5.3* 5.8*  ALBUMIN 2.6* 2.9*   No results found for this basename: LIPASE:5,AMYLASE:5 in the last 168 hours No results found for this basename: AMMONIA:5 in the last 168 hours CBC:  Lab 12/31/11 0555  WBC 4.6  NEUTROABS --  HGB 6.8*  HCT 24.6*  MCV 66.7*  PLT 230   Cardiac Enzymes: No results found for this basename: CKTOTAL:5,CKMB:5,CKMBINDEX:5,TROPONINI:5 in the last 168 hours BNP (last 3 results) No results found for this basename: PROBNP:3 in the last 8760 hours CBG: No results found for this basename: GLUCAP:5 in the last 168 hours  No results found for this or any previous visit (from the past 240 hour(s)).   Studies: No results found.  Scheduled Meds:   . pantoprazole (PROTONIX) IV  40 mg Intravenous Q12H  . [COMPLETED] potassium chloride  10 mEq Intravenous Q1 Hr x 4  . [COMPLETED] potassium chloride  40 mEq Oral Once  . sodium chloride  3 mL Intravenous Q12H  . [DISCONTINUED] potassium chloride SA  40 mEq Oral Once   Continuous Infusions:   . sodium chloride 10 mL/hr at 12/31/11 0500    Principal Problem:  *Anemia Active Problems:  GI bleed  Parkinson's disease  Hypokalemia    Time spent: 90    [facility] C  [facility] Pager [phone]. If 8PM-8AM, please contact night-coverage at [email or web address], password [facility] 12/31/2011, 8:19 AM  LOS: 1 day

## 2012-01-01 ENCOUNTER — Encounter (HOSPITAL_COMMUNITY)
Admission: EM | Disposition: A | Discharge status: Home / Self Care | Payer: Self-pay | Source: Home / Self Care | Attending: Internal Medicine

## 2012-01-01 ENCOUNTER — Encounter (HOSPITAL_COMMUNITY): Payer: Self-pay

## 2012-01-01 HISTORY — PX: ESOPHAGOGASTRODUODENOSCOPY: SHX5428

## 2012-01-01 LAB — CBC
Hemoglobin: 6.6 g/dL — CL (ref 13.0–17.0)
Platelets: 217 10*3/uL (ref 150–400)
RBC: 3.49 MIL/uL — ABNORMAL LOW (ref 4.22–5.81)
WBC: 6.7 10*3/uL (ref 4.0–10.5)

## 2012-01-01 LAB — PREPARE RBC (CROSSMATCH)

## 2012-01-01 SURGERY — EGD (ESOPHAGOGASTRODUODENOSCOPY)
Anesthesia: Moderate Sedation | Laterality: Left

## 2012-01-01 MED ORDER — MIDAZOLAM HCL 10 MG/2ML IJ SOLN
INTRAMUSCULAR | Status: DC | PRN
  Administered 2012-01-01: 1 mg via INTRAVENOUS

## 2012-01-01 MED ORDER — FENTANYL CITRATE 0.05 MG/ML IJ SOLN
INTRAMUSCULAR | Status: DC | PRN
  Administered 2012-01-01: 25 ug via INTRAVENOUS

## 2012-01-01 MED ORDER — BUTAMBEN-TETRACAINE-BENZOCAINE 2-2-14 % EX AERO
INHALATION_SPRAY | CUTANEOUS | Status: DC | PRN
  Administered 2012-01-01: 2 via TOPICAL

## 2012-01-01 MED ORDER — SODIUM CHLORIDE 0.9 % IV SOLN
INTRAVENOUS | Status: DC
  Administered 2012-01-02: 500 mL via INTRAVENOUS

## 2012-01-01 MED ORDER — PEG 3350-KCL-NA BICARB-NACL 420 G PO SOLR
4000.0000 mL | Freq: Once | ORAL | Status: AC
  Administered 2012-01-01: 4000 mL via ORAL
  Filled 2012-01-01 (×2): qty 4000

## 2012-01-01 MED ORDER — FLEET ENEMA 7-19 GM/118ML RE ENEM
2.0000 | ENEMA | Freq: Once | RECTAL | Status: AC
  Administered 2012-01-02: 1 via RECTAL
  Administered 2012-01-02: 2 via RECTAL
  Filled 2012-01-01 (×2): qty 2

## 2012-01-01 MED ORDER — FENTANYL CITRATE 0.05 MG/ML IJ SOLN
INTRAMUSCULAR | Status: AC
  Filled 2012-01-01: qty 2

## 2012-01-01 MED ORDER — MIDAZOLAM HCL 5 MG/ML IJ SOLN
INTRAMUSCULAR | Status: AC
  Filled 2012-01-01: qty 1

## 2012-01-01 NOTE — Interval H&P Note (Signed)
History and Physical Interval Note:  01/01/2012 8:44 AM  Cole Cunningham  has presented today for surgery, with the diagnosis of iron-deficiency anemia  The various methods of treatment have been discussed with the patient and family. After consideration of risks, benefits and other options for treatment, the patient has consented to  Procedure(s) (LRB) with comments: ESOPHAGOGASTRODUODENOSCOPY (EGD) (Left) as a surgical intervention .  The patient's history has been reviewed, patient examined, no change in status, stable for surgery.  I have reviewed the patient's chart and labs.  Questions were answered to the patient's satisfaction.     [name],Kerrie Latour L

## 2012-01-01 NOTE — Progress Notes (Signed)
Patient had negative EGD this morning. I have seen him approximately 3 years ago primarily for dysphagia it was felt to be due to his Parkinson's and he had workup at that time included during swallow et Karie Soda. He had undergone hip surgery and was on Coumadin. He had had some rectal bleeding with a hard bowel movement and on one occasion on Coumadin and was seen in the office and had been impacted with heme-negative stool. He had had previous colonoscopy by Dr. Corinda Gubler 2008 that his wife reported was negative. Long discussion with her and he continues to have constipation and she has to clean up his pants and clean him up after going to the bathroom. She has not seen any visible blood. In view of his profound anemia and negative EGD, we will plan a colonoscopy in the morning. I have discussed this with the wife and she is agreeable. We will set it up in the morning for Dr. Madilyn Fireman.

## 2012-01-01 NOTE — Evaluation (Signed)
Physical Therapy Evaluation Patient Details Name: Cole Cunningham MRN: [id number] DOB: [birth year] Today's Date: 01/01/2012 Time: 8295-6213 PT Time Calculation (min): 37 min  PT Assessment / Plan / Recommendation Clinical Impression  Pt adm with GI bleed.  Pt also with Parkinson's.  Needs skilled PT to maximize I and safety to decr burden of care.  Pt should be able to return home with wife.    PT Assessment  Patient needs continued PT services    Follow Up Recommendations  Home health PT (unless wife declines it)    Does the patient have the potential to tolerate intense rehabilitation      Barriers to Discharge        Equipment Recommendations       Recommendations for Other Services     Frequency Min 3X/week    Precautions / Restrictions Precautions Precautions: Fall   Pertinent Vitals/Pain N/A      Mobility  Bed Mobility Bed Mobility: Supine to Sit;Sitting - Scoot to Edge of Bed Supine to Sit: 4: Min assist;HOB elevated Sitting - Scoot to [place] of Bed: 3: Mod assist Details for Bed Mobility Assistance: assist to bring hips to EOB Transfers Transfers: Sit to Stand;Stand to Sit Sit to Stand: 4: Min assist;With upper extremity assist;With armrests;From bed;From chair/3-in-1 Stand to Sit: 4: Min assist;With upper extremity assist;With armrests;To chair/3-in-1;To bed Details for Transfer Assistance: Pt needs incr time and slight lift of hips. Ambulation/Gait Ambulation/Gait Assistance: 4: Min assist Ambulation Distance (Feet): 100 Feet Assistive device: Rolling walker Ambulation/Gait Assistance Details: Verbal cues to stay closer to walker and keep feet inside walker. Gait Pattern: Step-to pattern;Decreased step length - right;Decreased step length - left;Shuffle;Trunk flexed Gait velocity: decr    Shoulder Instructions     Exercises     PT Diagnosis: Difficulty walking;Generalized weakness  PT Problem List: Decreased strength;Decreased  balance;Decreased mobility;Decreased knowledge of use of DME PT Treatment Interventions: DME instruction;Gait training;Functional mobility training;Patient/family education;Therapeutic activities;Therapeutic exercise;Balance training   PT Goals Acute Rehab PT Goals PT Goal Formulation: With patient/family Time For Goal Achievement: 01/08/12 Potential to Achieve Goals: Good Pt will go Supine/Side to Sit: with supervision PT Goal: Supine/Side to Sit - Progress: Goal set today Pt will go Sit to Supine/Side: with supervision PT Goal: Sit to Supine/Side - Progress: Goal set today Pt will go Sit to Stand: with supervision PT Goal: Sit to Stand - Progress: Goal set today Pt will go Stand to Sit: with supervision PT Goal: Stand to Sit - Progress: Goal set today Pt will Ambulate: 51 - 150 feet;with supervision PT Goal: Ambulate - Progress: Goal set today  Visit Information  Last PT Received On: 01/01/12 Assistance Needed: +1    Subjective Data  Subjective: Pt only nodding head. Patient Stated Goal: Wife wants pt to come home.   Prior Functioning  Home Living Lives With: Spouse Available Help at Discharge: Family Type of Home: House Home Access: Level entry Home Layout: One level Bathroom Shower/Tub: Engineer, manufacturing systems: Standard Home Adaptive Equipment: Grab bars around toilet;Grab bars in shower;Tub transfer bench;Walker - rolling;Bedside commode/3-in-1;Hand-held shower hose (lift chair) Prior Function Level of Independence: Independent with assistive device(s) (for transfers and gait.) Driving: No Vocation: Retired    IT consultant  Overall Cognitive Status: History of cognitive impairments - at baseline Arousal/Alertness: Awake/alert Behavior During Session: Flat affect    Extremity/Trunk Assessment Right Lower Extremity Assessment RLE ROM/Strength/Tone: Deficits RLE ROM/Strength/Tone Deficits: grossly 3+/5 Left Lower Extremity Assessment LLE ROM/Strength/Tone:  Deficits LLE ROM/Strength/Tone Deficits:  grossly 3+/5   Balance Static Standing Balance Static Standing - Balance Support: Bilateral upper extremity supported (on walker) Static Standing - Level of Assistance: 4: Min assist  End of Session PT - End of Session Equipment Utilized During Treatment: Gait belt Activity Tolerance: Patient tolerated treatment well Patient left: in chair;with call bell/phone within reach;with family/visitor present Nurse Communication: Mobility status  GP     [name] 01/01/2012, 4:27 PM  [employer] PT [phone]

## 2012-01-01 NOTE — Progress Notes (Signed)
[facility] PROGRESS NOTE  Miqueas Whilden Hargens YQM:[id number] DOB: [birth year] DOA: 12/30/2011 PCP: Claude Manges, FNP  Assessment/Plan: 1. Symptomatic severe anemia secondary to GI bleed - .status post transfusion of 2 units of packed red blood cells, but hemoglobin is 6.6 this a.m. 12/13-been transfused 2 units of packed red blood cells follow and recheck. -EGD negative, appreciate GI assistance, colonoscopy planned for the morning. #2. Hypokalemia - probably from poor oral intake. Repleted #3. History of Parkinson's disease - continue home medications. PT OT consult     Code Status: FULL Family Communication: Wife at bedside Disposition Plan: Pending PT eval   Consultants:  GI consulted-eval pending  Procedures:  EGD 12/13-negative  Antibiotics:  NONE  HPI/Subjective: Status post EGD, denies any complaints.  Objective: Filed Vitals:   01/01/12 0835 01/01/12 0840 01/01/12 0845 01/01/12 0850  BP: 135/58 135/58 128/59 128/59  Pulse: 65     Temp:      TempSrc:      Resp: 16 48 14 28  Height:      Weight:      SpO2: 98% 99% 99% 99%    Intake/Output Summary (Last 24 hours) at 01/01/12 0957 Last data filed at 01/01/12 0644  Gross per 24 hour  Intake  542.5 ml  Output    200 ml  Net  342.5 ml   Filed Weights   12/30/11 2246  Weight: 62.8 kg (138 lb 7.2 oz)    Exam:   General:  He is alert, answers simple questions appropriately but speech difficult to understand(per wife since his stroke)  Cardiovascular: Regular rate and rhythm, normal S1-S2  Respiratory: Decreased breath sounds at bases, otherwise clear to auscultation  Abdomen: Soft, bowel sounds present nontender and nondistended no organomegaly and no mass palpable  Data Reviewed: Basic Metabolic Panel:  Lab 12/31/11 5284 12/30/11 1943  NA 137 140  K 4.3 2.9*  CL 105 105  CO2 25 26  GLUCOSE 81 94  BUN 11 13  CREATININE 0.58 0.66  CALCIUM 8.1* 8.4  MG -- --  PHOS -- --    Liver Function Tests:  Lab 12/31/11 0555 12/30/11 1943  AST 21 19  ALT 10 13  ALKPHOS 98 108  BILITOT 2.9* 0.7  PROT 5.3* 5.8*  ALBUMIN 2.6* 2.9*   No results found for this basename: LIPASE:5,AMYLASE:5 in the last 168 hours No results found for this basename: AMMONIA:5 in the last 168 hours CBC:  Lab 01/01/12 0430 12/31/11 1532 12/31/11 0555 12/30/11 2000  WBC 6.7 -- 4.6 4.6  NEUTROABS -- -- -- --  HGB 6.6* 7.2* 6.8* 5.2*  HCT 23.0* 25.4* 24.6* 21.1*  MCV 65.9* -- 66.7* 61.3*  PLT 217 -- 230 260   Cardiac Enzymes: No results found for this basename: CKTOTAL:5,CKMB:5,CKMBINDEX:5,TROPONINI:5 in the last 168 hours BNP (last 3 results) No results found for this basename: PROBNP:3 in the last 8760 hours CBG: No results found for this basename: GLUCAP:5 in the last 168 hours  No results found for this or any previous visit (from the past 240 hour(s)).   Studies: No results found.  Scheduled Meds:    . pantoprazole (PROTONIX) IV  40 mg Intravenous Q12H  . polyethylene glycol-electrolytes  4,000 mL Oral Once  . sodium chloride  3 mL Intravenous Q12H   Continuous Infusions:    . sodium chloride 500 mL (01/01/12 0754)  . sodium chloride      Principal Problem:  *Anemia Active Problems:  GI bleed  Parkinson's disease  Hypokalemia  Time spent: 30    [facility] C  [facility] Pager [phone]. If 8PM-8AM, please contact night-coverage at [email or web address], password [email or web address] 01/01/2012, 9:57 AM  LOS: 2 days

## 2012-01-01 NOTE — Progress Notes (Signed)
CRITICAL VALUE ALERT  Critical value received:  Hgb 6.6  Date of notification:  01/01/12  Time of notification:  0550  Critical value read back: yes  Nurse who received alert:  Jacquiline Doe, RN  MD notified (1st page):  [place]  Time of first page:  0600  Responding MD:  Georgia Dom  Time MD responded: [phone]

## 2012-01-01 NOTE — Op Note (Signed)
[name], [name], Cole Cunningham  MR#: [id number] BIRTHDATE: [birth year] , 79  yrs. old GENDER: Male ENDOSCOPIST:Babetta Paterson Randa Evens, MD REFERRED BY: [facility]. PCP: Mayer Masker PROCEDURE DATE:  01/01/2012 PROCEDURE:   EGD ASA CLASS:  class III INDICATIONS:   profound iron deficiency anemia MEDICATION:  fentanyl 25 mcg Versed 1 mg IV TOPICAL ANESTHETIC:   Cetacaine spray  DESCRIPTION OF PROCEDURE:   Pentax endoscope introduced into the esophagus with swallowing. Esophagus carefully examined and was normal. The GE junction was normal there was no significant hiatal hernia. No esophagitis or varices were seen. The stomach was entered and examined in the forward and retroflex view and found to be normal. The duodenal bulb and second duodenum were normal. There was no coffee ground material and no signs of active or recent bleeding.     COMPLICATIONS: None  ENDOSCOPIC IMPRESSION: 1. Profound iron deficiency anemia with negative EGD  RECOMMENDATIONS:  1. Colonoscopy tomorrow morning by Dr. Madilyn Fireman. Have discussed the procedure with the patient's wife who is his POA.  _______________________________ Rosalie DoctorCarman Ching, MD 01/01/2012 8:47 AM  CC: Deboraha Sprang at [facility]

## 2012-01-01 NOTE — Progress Notes (Signed)
Attempted to encourage pt to drink colon prep.  Pt unable to continue.  Paged [facility] to speak with MD on-call.  Will attempt to try an alternative method.

## 2012-01-02 ENCOUNTER — Inpatient Hospital Stay (HOSPITAL_COMMUNITY): Payer: Medicare Other

## 2012-01-02 ENCOUNTER — Encounter (HOSPITAL_COMMUNITY): Payer: Self-pay | Admitting: Gastroenterology

## 2012-01-02 ENCOUNTER — Encounter (HOSPITAL_COMMUNITY)
Admission: EM | Disposition: A | Discharge status: Home / Self Care | Payer: Self-pay | Source: Home / Self Care | Attending: Internal Medicine

## 2012-01-02 DIAGNOSIS — D72829 Elevated white blood cell count, unspecified: Secondary | ICD-10-CM | POA: Diagnosis present

## 2012-01-02 DIAGNOSIS — D490 Neoplasm of unspecified behavior of digestive system: Secondary | ICD-10-CM

## 2012-01-02 HISTORY — PX: FLEXIBLE SIGMOIDOSCOPY: SHX5431

## 2012-01-02 LAB — CBC
Hemoglobin: 10.3 g/dL — ABNORMAL LOW (ref 13.0–17.0)
MCH: 21.4 pg — ABNORMAL LOW (ref 26.0–34.0)
MCV: 70.3 fL — ABNORMAL LOW (ref 78.0–100.0)
RBC: 4.82 MIL/uL (ref 4.22–5.81)
RDW: 26.5 % — ABNORMAL HIGH (ref 11.5–15.5)

## 2012-01-02 LAB — TYPE AND SCREEN
ABO/RH(D): A POS
Unit division: 0
Unit division: 0

## 2012-01-02 LAB — PRO B NATRIURETIC PEPTIDE: Pro B Natriuretic peptide (BNP): 1043 pg/mL — ABNORMAL HIGH (ref 0–450)

## 2012-01-02 LAB — CEA: CEA: 1.7 ng/mL (ref 0.0–5.0)

## 2012-01-02 SURGERY — SIGMOIDOSCOPY, FLEXIBLE
Anesthesia: Moderate Sedation

## 2012-01-02 MED ORDER — LEVOFLOXACIN IN D5W 750 MG/150ML IV SOLN
750.0000 mg | INTRAVENOUS | Status: DC
  Administered 2012-01-02 – 2012-01-08 (×7): 750 mg via INTRAVENOUS
  Filled 2012-01-02 (×8): qty 150

## 2012-01-02 MED ORDER — FENTANYL CITRATE 0.05 MG/ML IJ SOLN
INTRAMUSCULAR | Status: DC | PRN
  Administered 2012-01-02: 25 ug via INTRAVENOUS

## 2012-01-02 MED ORDER — IOHEXOL 300 MG/ML  SOLN
20.0000 mL | INTRAMUSCULAR | Status: AC
  Administered 2012-01-02 (×2): 20 mL via ORAL

## 2012-01-02 MED ORDER — MIDAZOLAM HCL 5 MG/5ML IJ SOLN
INTRAMUSCULAR | Status: DC | PRN
  Administered 2012-01-02: 2 mg via INTRAVENOUS

## 2012-01-02 MED ORDER — IOHEXOL 300 MG/ML  SOLN
100.0000 mL | Freq: Once | INTRAMUSCULAR | Status: AC | PRN
  Administered 2012-01-02: 100 mL via INTRAVENOUS

## 2012-01-02 MED ORDER — FENTANYL CITRATE 0.05 MG/ML IJ SOLN
INTRAMUSCULAR | Status: AC
  Filled 2012-01-02: qty 2

## 2012-01-02 MED ORDER — MIDAZOLAM HCL 5 MG/ML IJ SOLN
INTRAMUSCULAR | Status: AC
  Filled 2012-01-02: qty 2

## 2012-01-02 NOTE — Progress Notes (Signed)
[facility] PROGRESS NOTE  Cole Cunningham ZOX:[id number] DOB: [birth year] DOA: 12/30/2011 PCP: Claude Manges, FNP  Assessment/Plan:   #1.Rectal mass-nearly obstructing, likely cancer per GI -Status post colonoscopy today, surgery consult -CT scan of abdomen and pelvis with no definite evidence of metastatic disease -Appreciate GI and surgery assistance. #2. Symptomatic severe anemia secondary to GI bleed -secondary to #1, status post transfusion of 2 units of packed red blood cells on admission, but hemoglobin was 6.6 12/13- was transfused another 2 units of packed red blood cells and hgb improved to 10.3 this a.m.12/14 -EGD negative, appreciate GI assistance, colonoscopy as above #3. Hypokalemia - probably from poor oral intake. Repleted #4. History of Parkinson's disease - continue home medications.  #5 leukocytosis/Probable PNA  -CT scan of abdomen and pelvis with bilateral lower airspace disease suspicious for pulmonary edema or pneumonia  -will check BNP, obtain chest x-ray, empiric antibiotics and follow.   Code Status: FULL Family Communication: Wife at bedside Disposition Plan: Pending PT eval   Consultants:  GI consulted-eval pending  Procedures:  EGD 12/13-negative  Antibiotics:  NONE  HPI/Subjective: Pt denies any complaints. Wife at bedside  Objective: Filed Vitals:   01/02/12 1005 01/02/12 1007 01/02/12 1010 01/02/12 1015  BP: 147/62 136/68 136/68 135/64  Pulse:  67    Temp:  98.5 F (36.9 C)    TempSrc:  Oral    Resp: 17 17 16 18   Height:      Weight:      SpO2: 98% 99% 99% 100%    Intake/Output Summary (Last 24 hours) at 01/02/12 1412 Last data filed at 01/02/12 0931  Gross per 24 hour  Intake    468 ml  Output    400 ml  Net     68 ml   Filed Weights   12/30/11 2246  Weight: 62.8 kg (138 lb 7.2 oz)    Exam:   General:  He is alert, answers simple questions appropriately but speech difficult to understand(per wife since  his stroke)  Cardiovascular: Regular rate and rhythm, normal S1-S2  Respiratory: Decreased breath sounds at bases, otherwise clear to auscultation  Abdomen: Soft, bowel sounds present nontender and nondistended no organomegaly and no mass palpable  Data Reviewed: Basic Metabolic Panel:  Lab 12/31/11 8119 12/30/11 1943  NA 137 140  K 4.3 2.9*  CL 105 105  CO2 25 26  GLUCOSE 81 94  BUN 11 13  CREATININE 0.58 0.66  CALCIUM 8.1* 8.4  MG -- --  PHOS -- --   Liver Function Tests:  Lab 12/31/11 0555 12/30/11 1943  AST 21 19  ALT 10 13  ALKPHOS 98 108  BILITOT 2.9* 0.7  PROT 5.3* 5.8*  ALBUMIN 2.6* 2.9*   No results found for this basename: LIPASE:5,AMYLASE:5 in the last 168 hours No results found for this basename: AMMONIA:5 in the last 168 hours CBC:  Lab 01/02/12 0522 01/01/12 0430 12/31/11 1532 12/31/11 0555 12/30/11 2000  WBC 13.9* 6.7 -- 4.6 4.6  NEUTROABS -- -- -- -- --  HGB 10.3* 6.6* 7.2* 6.8* 5.2*  HCT 33.9* 23.0* 25.4* 24.6* 21.1*  MCV 70.3* 65.9* -- 66.7* 61.3*  PLT 230 217 -- 230 260   Cardiac Enzymes: No results found for this basename: CKTOTAL:5,CKMB:5,CKMBINDEX:5,TROPONINI:5 in the last 168 hours BNP (last 3 results) No results found for this basename: PROBNP:3 in the last 8760 hours CBG: No results found for this basename: GLUCAP:5 in the last 168 hours  No results found for this or  any previous visit (from the past 240 hour(s)).   Studies: No results found.  Scheduled Meds:    . pantoprazole (PROTONIX) IV  40 mg Intravenous Q12H  . sodium chloride  3 mL Intravenous Q12H   Continuous Infusions:    . sodium chloride 500 mL (01/01/12 0754)    Principal Problem:  *Anemia Active Problems:  GI bleed  Parkinson's disease  Hypokalemia    Time spent: 35    [facility] C  [facility] Pager [phone]. If 8PM-8AM, please contact night-coverage at [email or web address], password [facility] 01/02/2012, 2:12 PM  LOS: 3 days

## 2012-01-02 NOTE — Progress Notes (Signed)
ANTIBIOTIC CONSULT NOTE - INITIAL  Pharmacy Consult for Levaquin Indication: pneumonia  No Known Allergies  Patient Measurements: Height: 5\' 8"  (172.7 cm) Weight: 138 lb 7.2 oz (62.8 kg) IBW/kg (Calculated) : 68.4   Vital Signs: Temp: 98.4 F (36.9 C) (12/14 1447) Temp src: Oral (12/14 1447) BP: 147/64 mmHg (12/14 1447) Pulse Rate: 88  (12/14 1447) Intake/Output from previous day: 12/13 0701 - 12/14 0700 In: 960.5 [P.O.:838; I.V.:100; Blood:12.5; IV Piggyback:10] Out: 400 [Urine:400] Intake/Output from this shift:    Labs:  Basename 01/02/12 0522 01/01/12 0430 12/31/11 1532 12/31/11 0555 12/30/11 1943  WBC 13.9* 6.7 -- 4.6 --  HGB 10.3* 6.6* 7.2* -- --  PLT 230 217 -- 230 --  LABCREA -- -- -- -- --  CREATININE -- -- -- 0.58 0.66   Estimated Creatinine Clearance: 68.7 ml/min (by C-G formula based on Cr of 0.58). No results found for this basename: VANCOTROUGH:2,VANCOPEAK:2,VANCORANDOM:2,GENTTROUGH:2,GENTPEAK:2,GENTRANDOM:2,TOBRATROUGH:2,TOBRAPEAK:2,TOBRARND:2,AMIKACINPEAK:2,AMIKACINTROU:2,AMIKACIN:2, in the last 72 hours   Microbiology: No results found for this or any previous visit (from the past 720 hour(s)).  Medical History: Past Medical History  Diagnosis Date  . Stroke   . Hip fracture   . Parkinson's disease     Assessment:  Mr Olesen is a 9 yom admitted with weakness, found to have GI bleed and is to start levaquin per pharmacy for probably pna as well. WBC 13.9 and pt is afebrile. His renal function is ok with an estimated crcl of 15ml/min. No cultures drawn   Plan:  Levaquin 750mg  IV daily  F/u renal function   Thank you,  Brett Fairy, PharmD, BCPS 01/02/2012 6:50 PM

## 2012-01-02 NOTE — Consult Note (Signed)
This patient is medically disable, but physiologically able to tolerate an operation.  What that operation will be has to be determined by his preoperative workup   The official reading of his chest, abdomen and pelvic CT scan is not back yet, but I do not see anything other than calcified gallstones.  No evidence of metastatic disease.  Marta Lamas. Gae Bon, MD, FACS [phone] [facility]

## 2012-01-02 NOTE — Op Note (Signed)
[facility] [address] [place], [address]   COLONOSCOPY PROCEDURE REPORT  PATIENT: Cole Cunningham, Cole Cunningham  MR#: [id number] BIRTHDATE: 1932-10-12 , 79  yrs. old GENDER: Male ENDOSCOPIST: Dorena Cookey, MD REFERRED BY: PROCEDURE DATE:  01/02/2012 PROCEDURE: ASA CLASS: INDICATIONS: severe anemia with negative upper endoscopy MEDICATIONS:   fentanyl 50 mcg, Versed 2 mg.  DESCRIPTION OF PROCEDURE: at about 12 cm from the anal verge there was the distal end of a hard circumferential nearly obstructing mass typical of rectal adenocarcinoma. There was a very small pinpoint lumen which the scope would not traverse beyond. The rectum distal to it appeared normal to the anus. Multiple biopsies were taken     COMPLICATIONS: None  ENDOSCOPIC IMPRESSION:nearly obstructing rectal mass likely carcinoma.  RECOMMENDATIONS:staging CT scan and surgical consult.    _______________________________ eSignedDorena Cookey, MD 01/02/2012 10:10 AM

## 2012-01-02 NOTE — Progress Notes (Signed)
OT Cancellation Note  Patient Details Name: ANTIONE OBAR MRN: [id number] DOB: 07-16-1932   Cancelled Treatment:    Reason Eval/Treat Not Completed: Patient at procedure or test/ unavailable (colonoscopy). Will re-attempt as time allows otherwise will see next date.  01/02/2012 Cipriano Mile OTR/L Pager [phone] Office [phone]

## 2012-01-02 NOTE — Progress Notes (Signed)
[facility] Progress Note  Subjective: Patient tolerated prep but did not get much results.  Objective: Vital signs in last 24 hours: Temp:  [97.8 F (36.6 C)-98.9 F (37.2 C)] 98.5 F (36.9 C) (12/14 1007) Pulse Rate:  [67-89] 67  (12/14 1007) Resp:  [15-25] 17  (12/14 1007) BP: (119-151)/(48-85) 136/68 mmHg (12/14 1007) SpO2:  [93 %-99 %] 99 % (12/14 1007) Weight change:    PE: Abdomen soft, slightly distended.  Lab Results: Results for orders placed during the hospital encounter of 12/30/11 (from the past 24 hour(s))  CBC     Status: Abnormal   Collection Time   01/02/12  5:22 AM      Component Value Range   WBC 13.9 (*) 4.0 - 10.5 K/uL   RBC 4.82  4.22 - 5.81 MIL/uL   Hemoglobin 10.3 (*) 13.0 - 17.0 g/dL   HCT 16.1 (*) 09.6 - 04.5 %   MCV 70.3 (*) 78.0 - 100.0 fL   MCH 21.4 (*) 26.0 - 34.0 pg   MCHC 30.4  30.0 - 36.0 g/dL   RDW 40.9 (*) 81.1 - 91.4 %   Platelets 230  150 - 400 K/uL    Studies/Results: Attempted colonoscopy showed circumferential obstructing mass at 12 cm. The lumen was too narrow to be traversed with the scope.   Assessment: Rectal mass, nearly obstructing  Plan: Staging chest abdominal and pelvic CT and will obtain surgical consult while awaiting pathology.    Bertie Simien C 01/02/2012, 10:12 AM

## 2012-01-02 NOTE — Consult Note (Signed)
Cole Cunningham [birth year]  [id number].   Primary Care MD: Dr. Barry Dienes  Requesting MD: Dr. Dorena Cookey Chief Complaint/Reason for Consult: rectal mass HPI: This is a 78 year old male who does not provide any history as he very rarely speaks secondary to a history of a CVA. All history is obtained from his spouse. Apparently last Friday the patient developed some generalized weakness and fatigue. I call his primary care doctor who was able to see him this past Wednesday. He has had no other complaints. After some mild work was drawn he was called later that night and informed to come to the emergency department for a hemoglobin of 5.2. Upon admission he was noted to be Hemoccult positive. He was admitted and gastroenterology was consulted. He had an upper endoscopy which was normal. He underwent a colonoscopy today and a 12 cm the patient was found to have a near obstructing rectal mass. This is likely to be adenocarcinoma. We have been asked evaluating the patient for further recommendations. The patient denies any abdominal pain. The patient has been having bowel movements at home per the wife. He denies any hematochezia. No nausea or vomiting.  Review of systems: Please see history of present illness otherwise all other systems have been reviewed and are negative.  Family History  Problem Relation Age of Onset  . Other Neg Hx     Past Medical History  Diagnosis Date  . Stroke   . Hip fracture   . Parkinson's disease     Past Surgical History  Procedure Date  . Fracture surgery   . Hernia repair   . Eye surgery     Social History:  reports that he has quit smoking. He does not have any smokeless tobacco history on file. He reports that he does not drink alcohol or use illicit drugs.  Allergies: No Known Allergies  Medications Prior to Admission  Medication Sig Dispense Refill  . aspirin EC 81 MG tablet Take 81 mg by mouth daily.      . Multiple Vitamin (MULTIVITAMIN WITH  MINERALS) TABS Take 1 tablet by mouth daily.        Blood pressure 135/64, pulse 67, temperature 98.5 F (36.9 C), temperature source Oral, resp. rate 18, height 5\' 8"  (1.727 m), weight 138 lb 7.2 oz (62.8 kg), SpO2 100.00%. Physical Exam: General: pleasant, white male who is laying in bed in NAD HEENT: head is normocephalic, atraumatic.  Sclera are noninjected.  PERRL.  Ears and nose without any masses or lesions.  Mouth is pink and moist Heart: regular, rate, and rhythm.  Normal s1,s2. No obvious murmurs, gallops, or rubs noted.  Palpable radial and pedal pulses bilaterally Lungs: CTAB, no wheezes, rhonchi, or rales noted.  Respiratory effort nonlabored Abd: soft, NT, ND, +BS, no masses, hernias, or organomegaly MS: all 4 extremities are symmetrical with no cyanosis, clubbing, or edema. Skin: warm and dry with no masses, lesions, or rashes Psych: The patient does not speak. He has random outbursts of crying.    Results for orders placed during the hospital encounter of 12/30/11 (from the past 48 hour(s))  HEMOGLOBIN AND HEMATOCRIT, BLOOD     Status: Abnormal   Collection Time   12/31/11  3:32 PM      Component Value Range Comment   Hemoglobin 7.2 (*) 13.0 - 17.0 g/dL    HCT 34.7 (*) 42.5 - 52.0 %   CBC     Status: Abnormal   Collection Time   01/01/12  4:30 AM      Component Value Range Comment   WBC 6.7  4.0 - 10.5 K/uL    RBC 3.49 (*) 4.22 - 5.81 MIL/uL    Hemoglobin 6.6 (*) 13.0 - 17.0 g/dL    HCT 96.0 (*) 45.4 - 52.0 %    MCV 65.9 (*) 78.0 - 100.0 fL    MCH 18.9 (*) 26.0 - 34.0 pg    MCHC 28.7 (*) 30.0 - 36.0 g/dL    RDW 09.8 (*) 11.9 - 15.5 %    Platelets 217  150 - 400 K/uL   PREPARE RBC (CROSSMATCH)     Status: Normal   Collection Time   01/01/12  6:07 AM      Component Value Range Comment   Order Confirmation ORDER PROCESSED BY BLOOD BANK     CBC     Status: Abnormal   Collection Time   01/02/12  5:22 AM      Component Value Range Comment   WBC 13.9 (*) 4.0 -  10.5 K/uL    RBC 4.82  4.22 - 5.81 MIL/uL    Hemoglobin 10.3 (*) 13.0 - 17.0 g/dL POST TRANSFUSION SPECIMEN   HCT 33.9 (*) 39.0 - 52.0 %    MCV 70.3 (*) 78.0 - 100.0 fL    MCH 21.4 (*) 26.0 - 34.0 pg    MCHC 30.4  30.0 - 36.0 g/dL    RDW 14.7 (*) 82.9 - 15.5 %    Platelets 230  150 - 400 K/uL    No results found.     Assessment/Plan 1. Near obstructing rectal mass, likely adenocarcinoma 2. Anemia likely secondary to #1 3. History of CVA 4. Parkinson's disease  Plan: The patient is currently getting a CT scan of the chest abdomen and pelvis to evaluate for metastatic disease. This rectal mass was biopsied and we will await final pathology. It is likely though that this is adenocarcinoma. I will have Dr. Lindie Spruce reviewed the case. However because of the near obstruction the patient may need a diverting colostomy to avoid a complete bowel obstruction. He may likely need an EUS to determine staging of the tumor. He will then likely need an oncology consult for further recommendations and treatment of this cancer. We will follow along with you. Thank you for this consultation. Please keep the patient on clear liquids.  Lonza Shimabukuro E 01/02/2012, 1:42 PM Pager: [phone]

## 2012-01-03 ENCOUNTER — Inpatient Hospital Stay (HOSPITAL_COMMUNITY): Payer: Medicare Other

## 2012-01-03 LAB — BASIC METABOLIC PANEL
BUN: 9 mg/dL (ref 6–23)
Chloride: 106 mEq/L (ref 96–112)
GFR calc Af Amer: >90 mL/min (ref >90)
GFR calc non Af Amer: >90 mL/min (ref >90)
Potassium: 3.4 mEq/L — ABNORMAL LOW (ref 3.5–5.1)
Sodium: 138 mEq/L (ref 135–145)

## 2012-01-03 LAB — CBC
HCT: 31.1 % — ABNORMAL LOW (ref 39.0–52.0)
MCHC: 29.6 g/dL — ABNORMAL LOW (ref 30.0–36.0)
Platelets: 192 10*3/uL (ref 150–400)
RDW: 28.4 % — ABNORMAL HIGH (ref 11.5–15.5)
WBC: 6.3 10*3/uL (ref 4.0–10.5)

## 2012-01-03 MED ORDER — POTASSIUM CHLORIDE CRYS ER 20 MEQ PO TBCR
40.0000 meq | EXTENDED_RELEASE_TABLET | Freq: Once | ORAL | Status: AC
  Administered 2012-01-03: 40 meq via ORAL
  Filled 2012-01-03: qty 2

## 2012-01-03 NOTE — Progress Notes (Signed)
Patient ID: Cole Cunningham, male   DOB: November 25, 1932, 78 y.o.   MRN: [id number] 1 Day Post-Op  Subjective: Pt nonverbal.  Wife states he is eating clears well.. No nausea or vomiting  Objective: Vital signs in last 24 hours: Temp:  [97.5 F (36.4 C)-98.5 F (36.9 C)] 97.5 F (36.4 C) (12/15 0521) Pulse Rate:  [58-88] 58  (12/15 0521) Resp:  [16-22] 18  (12/15 0521) BP: (132-147)/(46-68) 132/46 mmHg (12/15 0521) SpO2:  [95 %-100 %] 95 % (12/15 0521) Last BM Date: 01/02/12  Intake/Output from previous day: 12/14 0701 - 12/15 0700 In: 520 [P.O.:120; I.V.:240; IV Piggyback:160] Out: 600 [Urine:600] Intake/Output this shift:    PE: Abd: soft, ND, Nt, +BS  Lab Results:   [facility] 01/03/12 0610 01/02/12 0522  WBC 6.3 13.9*  HGB 9.2* 10.3*  HCT 31.1* 33.9*  PLT 192 230   BMET  Basename 01/03/12 0610  NA 138  K 3.4*  CL 106  CO2 24  GLUCOSE 80  BUN 9  CREATININE 0.61  CALCIUM 8.2*   PT/INR No results found for this basename: LABPROT:2,INR:2 in the last 72 hours CMP     Component Value Date/Time   NA 138 01/03/2012 0610   K 3.4* 01/03/2012 0610   CL 106 01/03/2012 0610   CO2 24 01/03/2012 0610   GLUCOSE 80 01/03/2012 0610   BUN 9 01/03/2012 0610   CREATININE 0.61 01/03/2012 0610   CALCIUM 8.2* 01/03/2012 0610   PROT 5.3* 12/31/2011 0555   ALBUMIN 2.6* 12/31/2011 0555   AST 21 12/31/2011 0555   ALT 10 12/31/2011 0555   ALKPHOS 98 12/31/2011 0555   BILITOT 2.9* 12/31/2011 0555   GFRNONAA >90 01/03/2012 0610   GFRAA >90 01/03/2012 0610   Lipase  No results found for this basename: lipase       Studies/Results: Ct Chest W Contrast  01/02/2012  *RADIOLOGY REPORT*  Clinical Data:  Newly diagnosed rectal carcinoma.  Staging.  CT CHEST, ABDOMEN AND PELVIS WITH CONTRAST  Technique:  Multidetector CT imaging of the chest, abdomen and pelvis was performed following the standard protocol during bolus administration of intravenous contrast.  Contrast:  OMNIPAQUE IOHEXOL 300 MG/ML  SOLN, 1 OMNIPAQUE IOHEXOL 300 MG/ML  SOLN  Comparison:   None.  CT CHEST  Findings:  No evidence of mediastinal or hilar masses.  Tiny bilateral pleural effusions are seen.  Bilateral lower lobe airspace disease is also seen which is symmetric and may be due to edema or pneumonia.  Shotty mediastinal lymph nodes are seen which are nonspecific but likely reactive in etiology.  No suspicious pulmonary nodules or masses are identified.  Cardiomegaly is noted, without pericardial effusion.  No evidence of axillary lymphadenopathy or chest wall mass.  No suspicious bone lesions are identified.  IMPRESSION:  1.  No definite evidence of metastatic disease. 2.  Bilateral lower lobe airspace disease, suspicious for pulmonary edema or pneumonia. 3. Tiny bilateral pleural effusions.  Nonspecific shotty mediastinal lymph nodes which may be reactive in etiology.  CT ABDOMEN AND PELVIS  Findings:  The abdominal parenchymal organs are normal in appearance.  Calcified gallstones are noted, however there is no evidence of cholecystitis or biliary ductal dilatation.  No evidence of hydronephrosis.  Beam hardening artifact from bilateral hip surgical hardware is seen which obscures visualization of the inferior pelvis.  No evidence of pelvic lymphadenopathy or other soft tissue masses. The no evidence of inflammatory process or abnormal fluid collections.  No evidence of bowel  obstruction or hernia.  No suspicious bone lesions are identified.  IMPRESSION:  1.  No evidence of metastatic disease. 2.  Cholelithiasis, without evidence of cholecystitis. 3.  Beam hardening artifact through the inferior pelvis due to bilateral hip hardware.   Original Report Authenticated By: Myles Rosenthal, M.D.    Ct Abdomen Pelvis W Contrast  01/02/2012  *RADIOLOGY REPORT*  Clinical Data:  Newly diagnosed rectal carcinoma.  Staging.  CT CHEST, ABDOMEN AND PELVIS WITH CONTRAST  Technique:  Multidetector CT imaging of the chest,  abdomen and pelvis was performed following the standard protocol during bolus administration of intravenous contrast.  Contrast: OMNIPAQUE IOHEXOL 300 MG/ML  SOLN, 1 OMNIPAQUE IOHEXOL 300 MG/ML  SOLN  Comparison:   None.  CT CHEST  Findings:  No evidence of mediastinal or hilar masses.  Tiny bilateral pleural effusions are seen.  Bilateral lower lobe airspace disease is also seen which is symmetric and may be due to edema or pneumonia.  Shotty mediastinal lymph nodes are seen which are nonspecific but likely reactive in etiology.  No suspicious pulmonary nodules or masses are identified.  Cardiomegaly is noted, without pericardial effusion.  No evidence of axillary lymphadenopathy or chest wall mass.  No suspicious bone lesions are identified.  IMPRESSION:  1.  No definite evidence of metastatic disease. 2.  Bilateral lower lobe airspace disease, suspicious for pulmonary edema or pneumonia. 3. Tiny bilateral pleural effusions.  Nonspecific shotty mediastinal lymph nodes which may be reactive in etiology.  CT ABDOMEN AND PELVIS  Findings:  The abdominal parenchymal organs are normal in appearance.  Calcified gallstones are noted, however there is no evidence of cholecystitis or biliary ductal dilatation.  No evidence of hydronephrosis.  Beam hardening artifact from bilateral hip surgical hardware is seen which obscures visualization of the inferior pelvis.  No evidence of pelvic lymphadenopathy or other soft tissue masses. The no evidence of inflammatory process or abnormal fluid collections.  No evidence of bowel obstruction or hernia.  No suspicious bone lesions are identified.  IMPRESSION:  1.  No evidence of metastatic disease. 2.  Cholelithiasis, without evidence of cholecystitis. 3.  Beam hardening artifact through the inferior pelvis due to bilateral hip hardware.   Original Report Authenticated By: Myles Rosenthal, M.D.     Anti-infectives: Anti-infectives     Start     Dose/Rate Route Frequency Ordered  Stop   01/02/12 2000   levofloxacin (LEVAQUIN) IVPB 750 mg        750 mg 100 mL/hr over 90 Minutes Intravenous Every 24 hours 01/02/12 1851             Assessment/Plan  1. Near obstructing rectal mass  Plan: 1. Due to location of mass and no findings of metastatic disease, the patient is likely a surgical candidate for resection.  I will make him NPO p MN in case we can proceed tomorrow.  As of now, do not think any further workup or tests are needed.  D/w patient and wife.  Will await Dr. Jamse Mead evaluation tomorrow to determine timing of surgery and further plans.     LOS: 4 days    Michelle Vanhise E 01/03/2012, 9:58 AM Pager: [phone]

## 2012-01-03 NOTE — Progress Notes (Signed)
General surgery attending note:  I have personally interviewed and examined this patient. Most of the history is obtained from his wife as he has an expressive speech deficit from his previous stroke. I agree with the assessment and  treatment plan outlined by Ms. Osborne.  He has a circumferential  rectal cancer, and the distal edge of the cancer is at 12 cm by colonoscopy.  The tumor appeared to be partially obstructing, but there is no history of abdominal pain, bloating, or nausea and vomiting.  Proximal colon could not be evaluated. CT scan shows no evidence of metastatic disease, but the pelvic tumor cannot be imaged because of significant artifact from hip prosthesis. He has gallstones. The colonoscopy was just done yesterday morning and we were consulted  yesterday afternoon.    CEA 1.7.  The biopsy report is pending but this is almost certainly an adenocarcinoma.  I doubt that we will proceed with surgery tomorrow, however consideration will need to be given to whether to simply go ahead with a low anterior resection this week,  with or without diverting loop ileostomy, or whether to ask medical oncology to see him to see if he would benefit from neoadjuvant chemoradiation. The tumor is probably somewhere near the peritoneal reflection.  I will advise Dr. Glenna Fellows of this patient's situation. Dr. Johna Sheriff will assume his care starting tomorrow for our surgical hospitalist service.   Angelia Mould. Derrell Lolling, M.D., [facility] General and Minimally invasive Surgery Breast and Colorectal Surgery Office:   [phone] Pager:   [phone]

## 2012-01-03 NOTE — Progress Notes (Signed)
[facility] PROGRESS NOTE  Cole Cunningham WUJ:[id number] DOB: [birth year] DOA: 12/30/2011 PCP: Claude Manges, FNP  Assessment/Plan:   #1.Rectal mass-nearly obstructing, likely cancer per GI -Status post colonoscopy 12/14 revealed rectal mass -CT scan of abdomen and pelvis with no definite evidence of metastatic disease, CEA 1.7(wnl) -per surgery note possible surgery in am, await attending input -Appreciate GI and surgery assistance. #2. Symptomatic severe anemia secondary to GI bleed -secondary to #1, status post transfusion of 2 units of packed red blood cells on admission, but hemoglobin was 6.6 12/13- was transfused another 2 units of packed red blood cells and hgb improved to 10.3 12/14, hgb 9.2 today, follow -EGD negative, appreciate GI assistance, colonoscopy as above #3. Hypokalemia - probably from poor oral intake. Replace k #4. History of Parkinson's disease - continue home medications.  #5 leukocytosis/Probable PNA  -CT scan of abdomen and pelvis with bilateral lower airspace disease suspicious for pulmonary edema or pneumonia  -continue empiric antibiotics and follow. -BNP elevated, check echo, f/u on CXR  leave IV at k[facility] as pt to be NPO for am surgery- he has no SOB   Code Status: FULL Family Communication: Wife at bedside Disposition Plan: Pending PT eval   Consultants:  GI consulted-eval pending  Procedures:  EGD 12/13-negative  Antibiotics:  NONE  HPI/Subjective: Pt denies any complaints. Denies sob. Findings of CT and colonoscopy discussed with wife at bedside  Objective: Filed Vitals:   01/02/12 1015 01/02/12 1447 01/02/12 2200 01/03/12 0521  BP: 135/64 147/64 133/53 132/46  Pulse:  88 70 58  Temp:  98.4 F (36.9 C) 98.4 F (36.9 C) 97.5 F (36.4 C)  TempSrc:  Oral Oral Oral  Resp: 18 18 18 18   Height:      Weight:      SpO2: 100% 96% 97% 95%    Intake/Output Summary (Last 24 hours) at 01/03/12 1027 Last data filed at 01/03/12  0600  Gross per 24 hour  Intake    520 ml  Output    600 ml  Net    -80 ml   Filed Weights   12/30/11 2246  Weight: 62.8 kg (138 lb 7.2 oz)    Exam:   General:  He is alert,inNAD  Cardiovascular: Regular rate and rhythm, normal S1-S2  Respiratory: Decreased breath sounds at bases, otherwise clear to auscultation  Abdomen: Soft, bowel sounds present nontender and nondistended no organomegaly and no mass palpable  Data Reviewed: Basic Metabolic Panel:  Lab 01/03/12 9562 12/31/11 0555 12/30/11 1943  NA 138 137 140  K 3.4* 4.3 2.9*  CL 106 105 105  CO2 24 25 26   GLUCOSE 80 81 94  BUN 9 11 13   CREATININE 0.61 0.58 0.66  CALCIUM 8.2* 8.1* 8.4  MG -- -- --  PHOS -- -- --   Liver Function Tests:  Lab 12/31/11 0555 12/30/11 1943  AST 21 19  ALT 10 13  ALKPHOS 98 108  BILITOT 2.9* 0.7  PROT 5.3* 5.8*  ALBUMIN 2.6* 2.9*   No results found for this basename: LIPASE:5,AMYLASE:5 in the last 168 hours No results found for this basename: AMMONIA:5 in the last 168 hours CBC:  Lab 01/03/12 0610 01/02/12 0522 01/01/12 0430 12/31/11 1532 12/31/11 0555 12/30/11 2000  WBC 6.3 13.9* 6.7 -- 4.6 4.6  NEUTROABS -- -- -- -- -- --  HGB 9.2* 10.3* 6.6* 7.2* 6.8* --  HCT 31.1* 33.9* 23.0* 25.4* 24.6* --  MCV 72.3* 70.3* 65.9* -- 66.7* 61.3*  PLT 192  230 217 -- 230 260   Cardiac Enzymes: No results found for this basename: CKTOTAL:5,CKMB:5,CKMBINDEX:5,TROPONINI:5 in the last 168 hours BNP (last 3 results)  Basename 01/02/12 0522  PROBNP 1043.0*   CBG: No results found for this basename: GLUCAP:5 in the last 168 hours  No results found for this or any previous visit (from the past 240 hour(s)).   Studies: Ct Chest W Contrast  01/02/2012  *RADIOLOGY REPORT*  Clinical Data:  Newly diagnosed rectal carcinoma.  Staging.  CT CHEST, ABDOMEN AND PELVIS WITH CONTRAST  Technique:  Multidetector CT imaging of the chest, abdomen and pelvis was performed following the standard protocol  during bolus administration of intravenous contrast.  Contrast: OMNIPAQUE IOHEXOL 300 MG/ML  SOLN, 1 OMNIPAQUE IOHEXOL 300 MG/ML  SOLN  Comparison:   None.  CT CHEST  Findings:  No evidence of mediastinal or hilar masses.  Tiny bilateral pleural effusions are seen.  Bilateral lower lobe airspace disease is also seen which is symmetric and may be due to edema or pneumonia.  Shotty mediastinal lymph nodes are seen which are nonspecific but likely reactive in etiology.  No suspicious pulmonary nodules or masses are identified.  Cardiomegaly is noted, without pericardial effusion.  No evidence of axillary lymphadenopathy or chest wall mass.  No suspicious bone lesions are identified.  IMPRESSION:  1.  No definite evidence of metastatic disease. 2.  Bilateral lower lobe airspace disease, suspicious for pulmonary edema or pneumonia. 3. Tiny bilateral pleural effusions.  Nonspecific shotty mediastinal lymph nodes which may be reactive in etiology.  CT ABDOMEN AND PELVIS  Findings:  The abdominal parenchymal organs are normal in appearance.  Calcified gallstones are noted, however there is no evidence of cholecystitis or biliary ductal dilatation.  No evidence of hydronephrosis.  Beam hardening artifact from bilateral hip surgical hardware is seen which obscures visualization of the inferior pelvis.  No evidence of pelvic lymphadenopathy or other soft tissue masses. The no evidence of inflammatory process or abnormal fluid collections.  No evidence of bowel obstruction or hernia.  No suspicious bone lesions are identified.  IMPRESSION:  1.  No evidence of metastatic disease. 2.  Cholelithiasis, without evidence of cholecystitis. 3.  Beam hardening artifact through the inferior pelvis due to bilateral hip hardware.   Original Report Authenticated By: Myles Rosenthal, M.D.    Ct Abdomen Pelvis W Contrast  01/02/2012  *RADIOLOGY REPORT*  Clinical Data:  Newly diagnosed rectal carcinoma.  Staging.  CT CHEST, ABDOMEN AND  PELVIS WITH CONTRAST  Technique:  Multidetector CT imaging of the chest, abdomen and pelvis was performed following the standard protocol during bolus administration of intravenous contrast.  Contrast: OMNIPAQUE IOHEXOL 300 MG/ML  SOLN, 1 OMNIPAQUE IOHEXOL 300 MG/ML  SOLN  Comparison:   None.  CT CHEST  Findings:  No evidence of mediastinal or hilar masses.  Tiny bilateral pleural effusions are seen.  Bilateral lower lobe airspace disease is also seen which is symmetric and may be due to edema or pneumonia.  Shotty mediastinal lymph nodes are seen which are nonspecific but likely reactive in etiology.  No suspicious pulmonary nodules or masses are identified.  Cardiomegaly is noted, without pericardial effusion.  No evidence of axillary lymphadenopathy or chest wall mass.  No suspicious bone lesions are identified.  IMPRESSION:  1.  No definite evidence of metastatic disease. 2.  Bilateral lower lobe airspace disease, suspicious for pulmonary edema or pneumonia. 3. Tiny bilateral pleural effusions.  Nonspecific shotty mediastinal lymph nodes which may  be reactive in etiology.  CT ABDOMEN AND PELVIS  Findings:  The abdominal parenchymal organs are normal in appearance.  Calcified gallstones are noted, however there is no evidence of cholecystitis or biliary ductal dilatation.  No evidence of hydronephrosis.  Beam hardening artifact from bilateral hip surgical hardware is seen which obscures visualization of the inferior pelvis.  No evidence of pelvic lymphadenopathy or other soft tissue masses. The no evidence of inflammatory process or abnormal fluid collections.  No evidence of bowel obstruction or hernia.  No suspicious bone lesions are identified.  IMPRESSION:  1.  No evidence of metastatic disease. 2.  Cholelithiasis, without evidence of cholecystitis. 3.  Beam hardening artifact through the inferior pelvis due to bilateral hip hardware.   Original Report Authenticated By: Myles Rosenthal, M.D.     Scheduled  Meds:    . levofloxacin (LEVAQUIN) IV  750 mg Intravenous Q24H  . pantoprazole (PROTONIX) IV  40 mg Intravenous Q12H  . sodium chloride  3 mL Intravenous Q12H   Continuous Infusions:    . sodium chloride 500 mL (01/01/12 0754)    Principal Problem:  *Anemia Active Problems:  GI bleed  Parkinson's disease  Hypokalemia  Leukocytosis    Time spent: 35    Rebbie Lauricella C  [facility] Pager [phone]. If 8PM-8AM, please contact night-coverage at [email or web address], password [facility] 01/03/2012, 10:27 AM  LOS: 4 days

## 2012-01-04 ENCOUNTER — Encounter (HOSPITAL_COMMUNITY): Payer: Self-pay | Admitting: Gastroenterology

## 2012-01-04 LAB — CBC
HCT: 31.8 % — ABNORMAL LOW (ref 39.0–52.0)
MCV: 74.1 fL — ABNORMAL LOW (ref 78.0–100.0)
RBC: 4.29 MIL/uL (ref 4.22–5.81)
RDW: 29.5 % — ABNORMAL HIGH (ref 11.5–15.5)
WBC: 5.2 10*3/uL (ref 4.0–10.5)

## 2012-01-04 LAB — SURGICAL PCR SCREEN: MRSA, PCR: NEGATIVE

## 2012-01-04 LAB — BASIC METABOLIC PANEL
CO2: 23 mEq/L (ref 19–32)
Chloride: 106 mEq/L (ref 96–112)
Creatinine, Ser: 0.63 mg/dL (ref 0.50–1.35)
GFR calc Af Amer: >90 mL/min (ref >90)
Potassium: 3.7 mEq/L (ref 3.5–5.1)
Sodium: 138 mEq/L (ref 135–145)

## 2012-01-04 MED ORDER — PERFLUTREN LIPID MICROSPHERE
INTRAVENOUS | Status: AC
  Filled 2012-01-04: qty 10

## 2012-01-04 MED ORDER — PERFLUTREN LIPID MICROSPHERE
1.0000 mL | INTRAVENOUS | Status: AC | PRN
  Administered 2012-01-04: 2 mL via INTRAVENOUS
  Filled 2012-01-04: qty 10

## 2012-01-04 NOTE — Progress Notes (Signed)
Physical Therapy Treatment Patient Details Name: Cole Cunningham MRN: [id number] DOB: [birth year] Today's Date: 01/04/2012 Time: 4098-1191 PT Time Calculation (min): 20 min  PT Assessment / Plan / Recommendation Comments on Treatment Session  Pt making steady progress toward goals with incr gait distance today and less assistance/cues needed today with mobility.    Follow Up Recommendations  Home health PT (unless wife declines it)           Equipment Recommendations  None recommended by PT       Frequency Min 3X/week   Plan Discharge plan remains appropriate;Frequency remains appropriate    Precautions / Restrictions Precautions Precautions: Fall       Mobility  Transfers Sit to Stand: 4: Min guard;With upper extremity assist;From chair/3-in-1 Stand to Sit: 4: Min assist;To chair/3-in-1;With upper extremity assist Details for Transfer Assistance: incr time needed to stand from chair and min assist to control descent with sitting down into chair Ambulation/Gait Ambulation/Gait Assistance: 4: Min guard;4: Min assist Ambulation Distance (Feet): 120 Feet Assistive device: Rolling walker Ambulation/Gait Assistance Details: min guard assist initially and min assist toward the end of the gait trial for balance/walker management. min cues for posture and for safe walker proximity with gait Gait Pattern: Step-through pattern;Decreased step length - left;Decreased step length - right;Decreased stride length;Narrow base of support;Shuffle;Trunk flexed Gait velocity: Decreased      PT Goals Acute Rehab PT Goals PT Goal: Sit to Stand - Progress: Progressing toward goal PT Goal: Stand to Sit - Progress: Progressing toward goal PT Goal: Ambulate - Progress: Progressing toward goal  Visit Information  Last PT Received On: 01/04/12 Assistance Needed: +1    Subjective Data  Subjective: Pt nodded "yes" in response to "Are you up for working with PT?". Had just finished  working with OT. Denied any pain.   Cognition  Overall Cognitive Status: Appears within functional limits for tasks assessed/performed Arousal/Alertness: Awake/alert Orientation Level: Appears intact for tasks assessed Behavior During Session: [facility] for tasks performed       End of Session PT - End of Session Equipment Utilized During Treatment: Gait belt Activity Tolerance: Patient tolerated treatment well Patient left: in chair;with call bell/phone within reach;with family/visitor present;with chair alarm set Nurse Communication: Mobility status   GP     Sallyanne Kuster 01/04/2012, 4:19 PM  Sallyanne Kuster, PTA Office- [phone]

## 2012-01-04 NOTE — Progress Notes (Signed)
Echocardiogram 2D Echocardiogram with Definity has been performed.  Cole Cunningham 01/04/2012, 2:24 PM

## 2012-01-04 NOTE — ED Provider Notes (Signed)
I saw and evaluated the patient, reviewed the resident's note and I agree with the findings and plan.   [name] Faulstich, MD 01/04/12 0729 

## 2012-01-04 NOTE — Progress Notes (Deleted)
Echocardiogram 2D Echocardiogram has been performed.  Cole Cunningham 01/04/2012, 2:22 PM

## 2012-01-04 NOTE — Progress Notes (Signed)
Patient ID: Cole Cunningham, male   DOB: 19-Nov-1932, 78 y.o.   MRN: [id number] Patient ID: Cole Cunningham, male   DOB: 1932-10-02, 78 y.o.   MRN: [id number] 2 Days Post-Op  Subjective: Pt nonverbal.  No nausea or vomiting, patient has been NPO since MN.  Objective: Vital signs in last 24 hours: Temp:  [97.9 F (36.6 C)-98.6 F (37 C)] 97.9 F (36.6 C) (12/16 0453) Pulse Rate:  [58-68] 58  (12/16 0453) Resp:  [18-20] 18  (12/16 0453) BP: (132-142)/(45-53) 140/45 mmHg (12/16 0453) SpO2:  [94 %-95 %] 94 % (12/16 0453) Last BM Date: 01/02/12  Intake/Output from previous day: 12/15 0701 - 12/16 0700 In: 220 [P.O.:60; IV Piggyback:160] Out: -  Intake/Output this shift:    PE: Chest: CTA Cardiac: RRR Abd: soft, ND, Nt, +BS Labs: H&H stable VSS, afebrile  Lab Results:   Basename 01/04/12 0500 01/03/12 0610  WBC 5.2 6.3  HGB 9.3* 9.2*  HCT 31.8* 31.1*  PLT 208 192   BMET  Basename 01/04/12 0500 01/03/12 0610  NA 138 138  K 3.7 3.4*  CL 106 106  CO2 23 24  GLUCOSE 86 80  BUN 9 9  CREATININE 0.63 0.61  CALCIUM 8.3* 8.2*   PT/INR No results found for this basename: LABPROT:2,INR:2 in the last 72 hours CMP     Component Value Date/Time   NA 138 01/04/2012 0500   K 3.7 01/04/2012 0500   CL 106 01/04/2012 0500   CO2 23 01/04/2012 0500   GLUCOSE 86 01/04/2012 0500   BUN 9 01/04/2012 0500   CREATININE 0.63 01/04/2012 0500   CALCIUM 8.3* 01/04/2012 0500   PROT 5.3* 12/31/2011 0555   ALBUMIN 2.6* 12/31/2011 0555   AST 21 12/31/2011 0555   ALT 10 12/31/2011 0555   ALKPHOS 98 12/31/2011 0555   BILITOT 2.9* 12/31/2011 0555   GFRNONAA >90 01/04/2012 0500   GFRAA >90 01/04/2012 0500   Lipase  No results found for this basename: lipase       Studies/Results: Ct Chest W Contrast  01/02/2012  *RADIOLOGY REPORT*  Clinical Data:  Newly diagnosed rectal carcinoma.  Staging.  CT CHEST, ABDOMEN AND PELVIS WITH CONTRAST  Technique:  Multidetector CT  imaging of the chest, abdomen and pelvis was performed following the standard protocol during bolus administration of intravenous contrast.  Contrast: OMNIPAQUE IOHEXOL 300 MG/ML  SOLN, 1 OMNIPAQUE IOHEXOL 300 MG/ML  SOLN  Comparison:   None.  CT CHEST  Findings:  No evidence of mediastinal or hilar masses.  Tiny bilateral pleural effusions are seen.  Bilateral lower lobe airspace disease is also seen which is symmetric and may be due to edema or pneumonia.  Shotty mediastinal lymph nodes are seen which are nonspecific but likely reactive in etiology.  No suspicious pulmonary nodules or masses are identified.  Cardiomegaly is noted, without pericardial effusion.  No evidence of axillary lymphadenopathy or chest wall mass.  No suspicious bone lesions are identified.  IMPRESSION:  1.  No definite evidence of metastatic disease. 2.  Bilateral lower lobe airspace disease, suspicious for pulmonary edema or pneumonia. 3. Tiny bilateral pleural effusions.  Nonspecific shotty mediastinal lymph nodes which may be reactive in etiology.  CT ABDOMEN AND PELVIS  Findings:  The abdominal parenchymal organs are normal in appearance.  Calcified gallstones are noted, however there is no evidence of cholecystitis or biliary ductal dilatation.  No evidence of hydronephrosis.  Beam hardening artifact from bilateral hip surgical hardware is seen  which obscures visualization of the inferior pelvis.  No evidence of pelvic lymphadenopathy or other soft tissue masses. The no evidence of inflammatory process or abnormal fluid collections.  No evidence of bowel obstruction or hernia.  No suspicious bone lesions are identified.  IMPRESSION:  1.  No evidence of metastatic disease. 2.  Cholelithiasis, without evidence of cholecystitis. 3.  Beam hardening artifact through the inferior pelvis due to bilateral hip hardware.   Original Report Authenticated By: Myles Rosenthal, M.D.    Ct Abdomen Pelvis W Contrast  01/02/2012  *RADIOLOGY REPORT*   Clinical Data:  Newly diagnosed rectal carcinoma.  Staging.  CT CHEST, ABDOMEN AND PELVIS WITH CONTRAST  Technique:  Multidetector CT imaging of the chest, abdomen and pelvis was performed following the standard protocol during bolus administration of intravenous contrast.  Contrast: OMNIPAQUE IOHEXOL 300 MG/ML  SOLN, 1 OMNIPAQUE IOHEXOL 300 MG/ML  SOLN  Comparison:   None.  CT CHEST  Findings:  No evidence of mediastinal or hilar masses.  Tiny bilateral pleural effusions are seen.  Bilateral lower lobe airspace disease is also seen which is symmetric and may be due to edema or pneumonia.  Shotty mediastinal lymph nodes are seen which are nonspecific but likely reactive in etiology.  No suspicious pulmonary nodules or masses are identified.  Cardiomegaly is noted, without pericardial effusion.  No evidence of axillary lymphadenopathy or chest wall mass.  No suspicious bone lesions are identified.  IMPRESSION:  1.  No definite evidence of metastatic disease. 2.  Bilateral lower lobe airspace disease, suspicious for pulmonary edema or pneumonia. 3. Tiny bilateral pleural effusions.  Nonspecific shotty mediastinal lymph nodes which may be reactive in etiology.  CT ABDOMEN AND PELVIS  Findings:  The abdominal parenchymal organs are normal in appearance.  Calcified gallstones are noted, however there is no evidence of cholecystitis or biliary ductal dilatation.  No evidence of hydronephrosis.  Beam hardening artifact from bilateral hip surgical hardware is seen which obscures visualization of the inferior pelvis.  No evidence of pelvic lymphadenopathy or other soft tissue masses. The no evidence of inflammatory process or abnormal fluid collections.  No evidence of bowel obstruction or hernia.  No suspicious bone lesions are identified.  IMPRESSION:  1.  No evidence of metastatic disease. 2.  Cholelithiasis, without evidence of cholecystitis. 3.  Beam hardening artifact through the inferior pelvis due to bilateral  hip hardware.   Original Report Authenticated By: Myles Rosenthal, M.D.    Dg Chest Port 1 View  01/03/2012  *RADIOLOGY REPORT*  Clinical Data: Pulmonary infiltrates.  PORTABLE CHEST - 1 VIEW  Comparison: CT scan dated 01/02/2012 and chest x-ray dated 04/24/2011  Findings: Heart size and vascularity are within normal limits. Tiny bilateral effusions.  Faint small infiltrates at both lung bases which correlate with the abnormalities seen on chest CT.  No progression.  IMPRESSION: Tiny effusions and small bilateral lower lobe infiltrates posteriorly.   Original Report Authenticated By: Francene Boyers, M.D.     Anti-infectives: Anti-infectives     Start     Dose/Rate Route Frequency Ordered Stop   01/02/12 2000   levofloxacin (LEVAQUIN) IVPB 750 mg        750 mg 100 mL/hr over 90 Minutes Intravenous Every 24 hours 01/02/12 1851             Assessment/Plan  Patient Active Problem List  Diagnosis  . Anemia  . GI bleed  . Parkinson's disease  . Hypokalemia  . Leukocytosis   1.  Near obstructing rectal mass  Plan: 1. Due to location of mass and no findings of metastatic disease, the patient is likely a surgical candidate for resection.   Will await Dr. Jamse Mead evaluation today (sigmoidoscopy at bedside)  to determine timing of surgery and further plans.      LOS: 5 days    [facility] Pager # [phone]  01/04/2012, 8:33 AM

## 2012-01-04 NOTE — Progress Notes (Signed)
[facility] PROGRESS NOTE  Cole Cunningham NWG:[id number] DOB: 04-11-1932 DOA: 12/30/2011 PCP: Claude Manges, FNP  Assessment/Plan:   #1.Rectal mass-nearly obstructing, likely cancer per GI -Status post colonoscopy 12/14 revealed rectal mass -CT scan of abdomen and pelvis with no definite evidence of metastatic disease, CEA 1.7(wnl) -per surgery note bedside sigmoidoscopy and this a.m., and then further management per surgery -Appreciate GI and surgery assistance. #2. Symptomatic severe anemia secondary to GI bleed -secondary to #1, status post transfusion of 2 units of packed red blood cells on admission, but hemoglobin was 6.6 12/13- was transfused another 2 units of packed red blood cells and hgb improved to 10.3 12/14, hgb 9.3 12/16, remaining stable. -EGD negative, appreciate GI assistance, colonoscopy as above #3. Hypokalemia - probably from poor oral intake. Replace k #4. History of Parkinson's disease - continue home medications.  #5 leukocytosis/Probable PNA  -CT scan of abdomen and pelvis with bilateral lower airspace disease suspicious for pulmonary edema or pneumonia  -continue empiric antibiotics and follow. -BNP elevated, follow up on echo, CXR with bilateral lobe infiltrates.    Code Status: FULL Family Communication: Wife at bedside Disposition Plan: Pending PT eval   Consultants:  GI consulted-eval pending  Procedures:  EGD 12/13-negative  Antibiotics:  NONE  HPI/Subjective: Patient denies any new complaints wife at bedside  Objective: Filed Vitals:   01/03/12 2132 01/04/12 0453 01/04/12 1325 01/04/12 1340  BP: 142/50 140/45 149/45 143/52  Pulse: 68 58 55 71  Temp: 98.4 F (36.9 C) 97.9 F (36.6 C) 97.7 F (36.5 C) 98.2 F (36.8 C)  TempSrc: Oral Oral Oral Oral  Resp: 20 18 18 20   Height:      Weight:      SpO2: 94% 94% 97% 94%    Intake/Output Summary (Last 24 hours) at 01/04/12 1407 Last data filed at 01/04/12 0900  Gross  per 24 hour  Intake    220 ml  Output      0 ml  Net    220 ml   Filed Weights   12/30/11 2246  Weight: 62.8 kg (138 lb 7.2 oz)    Exam:   General:  He is alert,inNAD  Cardiovascular: Regular rate and rhythm, normal S1-S2  Respiratory: Decreased breath sounds at bases, otherwise clear to auscultation  Abdomen: Soft, bowel sounds present nontender and nondistended no organomegaly and no mass palpable  Data Reviewed: Basic Metabolic Panel:  Lab 01/04/12 5784 01/03/12 0610 12/31/11 0555 12/30/11 1943  NA 138 138 137 140  K 3.7 3.4* 4.3 2.9*  CL 106 106 105 105  CO2 23 24 25 26   GLUCOSE 86 80 81 94  BUN 9 9 11 13   CREATININE 0.63 0.61 0.58 0.66  CALCIUM 8.3* 8.2* 8.1* 8.4  MG -- -- -- --  PHOS -- -- -- --   Liver Function Tests:  Lab 12/31/11 0555 12/30/11 1943  AST 21 19  ALT 10 13  ALKPHOS 98 108  BILITOT 2.9* 0.7  PROT 5.3* 5.8*  ALBUMIN 2.6* 2.9*   No results found for this basename: LIPASE:5,AMYLASE:5 in the last 168 hours No results found for this basename: AMMONIA:5 in the last 168 hours CBC:  Lab 01/04/12 0500 01/03/12 0610 01/02/12 0522 01/01/12 0430 12/31/11 1532 12/31/11 0555  WBC 5.2 6.3 13.9* 6.7 -- 4.6  NEUTROABS -- -- -- -- -- --  HGB 9.3* 9.2* 10.3* 6.6* 7.2* --  HCT 31.8* 31.1* 33.9* 23.0* 25.4* --  MCV 74.1* 72.3* 70.3* 65.9* -- 66.7*  PLT 208 192 230 217 -- 230   Cardiac Enzymes: No results found for this basename: CKTOTAL:5,CKMB:5,CKMBINDEX:5,TROPONINI:5 in the last 168 hours BNP (last 3 results)  Basename 01/02/12 0522  PROBNP 1043.0*   CBG: No results found for this basename: GLUCAP:5 in the last 168 hours  No results found for this or any previous visit (from the past 240 hour(s)).   Studies: Ct Chest W Contrast  01/02/2012  *RADIOLOGY REPORT*  Clinical Data:  Newly diagnosed rectal carcinoma.  Staging.  CT CHEST, ABDOMEN AND PELVIS WITH CONTRAST  Technique:  Multidetector CT imaging of the chest, abdomen and pelvis was  performed following the standard protocol during bolus administration of intravenous contrast.  Contrast: OMNIPAQUE IOHEXOL 300 MG/ML  SOLN, 1 OMNIPAQUE IOHEXOL 300 MG/ML  SOLN  Comparison:   None.  CT CHEST  Findings:  No evidence of mediastinal or hilar masses.  Tiny bilateral pleural effusions are seen.  Bilateral lower lobe airspace disease is also seen which is symmetric and may be due to edema or pneumonia.  Shotty mediastinal lymph nodes are seen which are nonspecific but likely reactive in etiology.  No suspicious pulmonary nodules or masses are identified.  Cardiomegaly is noted, without pericardial effusion.  No evidence of axillary lymphadenopathy or chest wall mass.  No suspicious bone lesions are identified.  IMPRESSION:  1.  No definite evidence of metastatic disease. 2.  Bilateral lower lobe airspace disease, suspicious for pulmonary edema or pneumonia. 3. Tiny bilateral pleural effusions.  Nonspecific shotty mediastinal lymph nodes which may be reactive in etiology.  CT ABDOMEN AND PELVIS  Findings:  The abdominal parenchymal organs are normal in appearance.  Calcified gallstones are noted, however there is no evidence of cholecystitis or biliary ductal dilatation.  No evidence of hydronephrosis.  Beam hardening artifact from bilateral hip surgical hardware is seen which obscures visualization of the inferior pelvis.  No evidence of pelvic lymphadenopathy or other soft tissue masses. The no evidence of inflammatory process or abnormal fluid collections.  No evidence of bowel obstruction or hernia.  No suspicious bone lesions are identified.  IMPRESSION:  1.  No evidence of metastatic disease. 2.  Cholelithiasis, without evidence of cholecystitis. 3.  Beam hardening artifact through the inferior pelvis due to bilateral hip hardware.   Original Report Authenticated By: Myles Rosenthal, M.D.    Ct Abdomen Pelvis W Contrast  01/02/2012  *RADIOLOGY REPORT*  Clinical Data:  Newly diagnosed rectal  carcinoma.  Staging.  CT CHEST, ABDOMEN AND PELVIS WITH CONTRAST  Technique:  Multidetector CT imaging of the chest, abdomen and pelvis was performed following the standard protocol during bolus administration of intravenous contrast.  Contrast: OMNIPAQUE IOHEXOL 300 MG/ML  SOLN, 1 OMNIPAQUE IOHEXOL 300 MG/ML  SOLN  Comparison:   None.  CT CHEST  Findings:  No evidence of mediastinal or hilar masses.  Tiny bilateral pleural effusions are seen.  Bilateral lower lobe airspace disease is also seen which is symmetric and may be due to edema or pneumonia.  Shotty mediastinal lymph nodes are seen which are nonspecific but likely reactive in etiology.  No suspicious pulmonary nodules or masses are identified.  Cardiomegaly is noted, without pericardial effusion.  No evidence of axillary lymphadenopathy or chest wall mass.  No suspicious bone lesions are identified.  IMPRESSION:  1.  No definite evidence of metastatic disease. 2.  Bilateral lower lobe airspace disease, suspicious for pulmonary edema or pneumonia. 3. Tiny bilateral pleural effusions.  Nonspecific shotty mediastinal lymph nodes  which may be reactive in etiology.  CT ABDOMEN AND PELVIS  Findings:  The abdominal parenchymal organs are normal in appearance.  Calcified gallstones are noted, however there is no evidence of cholecystitis or biliary ductal dilatation.  No evidence of hydronephrosis.  Beam hardening artifact from bilateral hip surgical hardware is seen which obscures visualization of the inferior pelvis.  No evidence of pelvic lymphadenopathy or other soft tissue masses. The no evidence of inflammatory process or abnormal fluid collections.  No evidence of bowel obstruction or hernia.  No suspicious bone lesions are identified.  IMPRESSION:  1.  No evidence of metastatic disease. 2.  Cholelithiasis, without evidence of cholecystitis. 3.  Beam hardening artifact through the inferior pelvis due to bilateral hip hardware.   Original Report  Authenticated By: Myles Rosenthal, M.D.    Dg Chest Port 1 View  01/03/2012  *RADIOLOGY REPORT*  Clinical Data: Pulmonary infiltrates.  PORTABLE CHEST - 1 VIEW  Comparison: CT scan dated 01/02/2012 and chest x-ray dated 04/24/2011  Findings: Heart size and vascularity are within normal limits. Tiny bilateral effusions.  Faint small infiltrates at both lung bases which correlate with the abnormalities seen on chest CT.  No progression.  IMPRESSION: Tiny effusions and small bilateral lower lobe infiltrates posteriorly.   Original Report Authenticated By: Francene Boyers, M.D.     Scheduled Meds:    . levofloxacin (LEVAQUIN) IV  750 mg Intravenous Q24H  . pantoprazole (PROTONIX) IV  40 mg Intravenous Q12H  . sodium chloride  3 mL Intravenous Q12H   Continuous Infusions:    . sodium chloride 500 mL (01/01/12 0754)    Principal Problem:  *Anemia Active Problems:  GI bleed  Parkinson's disease  Hypokalemia  Leukocytosis    Time spent: 35    Armenta Erskin C  [facility] Pager [phone]. If 8PM-8AM, please contact night-coverage at [email or web address], password [facility] 01/04/2012, 2:07 PM  LOS: 5 days

## 2012-01-04 NOTE — Progress Notes (Signed)
12/30/11 2241  Clinical Encounter Type  Visited With Patient and family together  Visit Type Spiritual support;Social support  Referral From Nurse;Other (Comment)  Advance Directives (For Healthcare)  Advance Directive Patient would like information  Patient requests advance directive information Referral made to social work  Pre-existing out of facility DNR order (yellow form or pink MOST form) No    Visited patient to assist with their desire to complete an Advance Directive.  The AD was completed by the patient, family, notarized and 2 witnesses signed the document. Stayed with the patient and family for emotional and spiritual support prior to scheduled surgery. Follow up is recommended. Veryl Speak

## 2012-01-04 NOTE — Progress Notes (Signed)
Occupational Therapy Treatment Patient Details Name: Cole Cunningham MRN: [id number] DOB: June 20, 1932 Today's Date: 01/04/2012 Time: 4098-1191 OT Time Calculation (min): 38 min  OT Assessment / Plan / Recommendation Comments on Treatment Session Pt. improving with activity tolerance and abililty to assist with toilet transfer and toilet hygiene    Follow Up Recommendations  Home health OT;Supervision/Assistance - 24 hour    Barriers to Discharge       Equipment Recommendations  None recommended by OT    Recommendations for Other Services    Frequency Min 2X/week   Plan Discharge plan remains appropriate    Precautions / Restrictions Precautions Precautions: Fall Restrictions Weight Bearing Restrictions: No   Pertinent Vitals/Pain     ADL  Grooming: Wash/dry face;Set up;Supervision/safety Where Assessed - Grooming: Unsupported sitting Upper Body Bathing: Min guard Where Assessed - Upper Body Bathing: Supported sitting Lower Body Bathing: Maximal assistance Where Assessed - Lower Body Bathing: Supported sit to stand Upper Body Dressing: Maximal assistance (gown) Where Assessed - Upper Body Dressing: Unsupported sitting Lower Body Dressing: Minimal assistance Where Assessed - Lower Body Dressing: Supported sit to Pharmacist, hospital: Minimal assistance Toilet Transfer Method: Surveyor, minerals: Materials engineer and Hygiene: Moderate assistance Where Assessed - Toileting Clothing Manipulation and Hygiene: Standing Equipment Used: Rolling walker Transfers/Ambulation Related to ADLs: min A ambulating in room and hallway ADL Comments: Pt incontinent of urine.  Therapist assisted pt with peri care and changed gown.  Wife present, and reports pt. was total A with all BADLs except self feeding, toilet transfer, and shower transfer into walk in shower with seat.  SHe reports he has an electric toothbrush and will  sometimes brush his own teeth.  He also was incontinent of urine and wore pull ups.  Pt. requested to ambulate in hallway after ADL activity    OT Diagnosis: Generalized weakness;Acute pain  OT Problem List: Decreased strength;Decreased activity tolerance;Impaired balance (sitting and/or standing);Decreased knowledge of use of DME or AE;Decreased knowledge of precautions OT Treatment Interventions: Self-care/ADL training;Therapeutic activities;Patient/family education;Balance training   OT Goals Acute Rehab OT Goals OT Goal Formulation: With patient Time For Goal Achievement: 01/11/12 Potential to Achieve Goals: Good ADL Goals Pt Will Perform Grooming: with supervision;Standing at sink;Sitting at sink ADL Goal: Grooming - Progress: Goal set today Pt Will Perform Upper Body Dressing: with set-up;Sitting, chair;Sitting, bed ADL Goal: Upper Body Dressing - Progress: Goal set today Pt Will Perform Lower Body Dressing: Sit to stand from chair;with supervision;Sit to stand from bed ADL Goal: Lower Body Dressing - Progress: Goal set today Pt Will Transfer to Toilet: with supervision;Ambulation;with DME ADL Goal: Toilet Transfer - Progress: Progressing toward goals Pt Will Perform Toileting - Clothing Manipulation: Independently;Standing ADL Goal: Toileting - Clothing Manipulation - Progress: Goal set today Pt Will Perform Toileting - Hygiene: with modified independence;Sitting on 3-in-1 or toilet ADL Goal: Toileting - Hygiene - Progress: Goal set today Pt Will Perform Tub/Shower Transfer: with min assist;Ambulation;with DME ADL Goal: Tub/Shower Transfer - Progress: Goal set today  Visit Information  Last OT Received On: 01/04/12 Assistance Needed: +1    Subjective Data  Subjective: No verbalizations but pt agreeable to getting cleaned up as condom cath had come off prior to therapy entering room Patient Stated Goal: None stated   Prior Functioning  Home Living Lives With:  Spouse Available Help at Discharge: Family Type of Home: House Home Access: Level entry Home Layout: One level Bathroom Shower/Tub: Engineer, manufacturing systems:  Standard Home Adaptive Equipment: Grab bars around toilet;Grab bars in shower;Tub transfer bench;Walker - rolling;Bedside commode/3-in-1;Hand-held shower hose Prior Function Level of Independence: Independent with assistive device(s) Driving: No Vocation: Retired Musician: Expressive difficulties    Cognition  Overall Cognitive Status: Appears within functional limits for tasks assessed/performed Arousal/Alertness: Awake/alert Orientation Level: Appears intact for tasks assessed Behavior During Session: [facility] for tasks performed    Mobility  Shoulder Instructions Bed Mobility Supine to Sit: 4: Min assist Sitting - Scoot to Edge of Bed: 3: Mod assist Details for Bed Mobility Assistance: assist to bring hips to EOB Transfers Transfers: Sit to Stand;Stand to Sit Sit to Stand: 4: Min assist;From chair/3-in-1 Stand to Sit: 4: Min assist;To bed;With upper extremity assist Details for Transfer Assistance: VC for hand placement and assist to achieve full upright standing as well as to control descent to chair       Exercises      Balance Static Standing Balance Static Standing - Balance Support: Bilateral upper extremity supported Static Standing - Level of Assistance:  (min guard assist)   End of Session OT - End of Session Equipment Utilized During Treatment: Gait belt Activity Tolerance: Patient tolerated treatment well Patient left: in chair;with call bell/phone within reach;with family/visitor present Nurse Communication: Mobility status  GO     Tyisha Cressy M 01/04/2012, 6:13 PM

## 2012-01-04 NOTE — Evaluation (Signed)
Occupational Therapy Evaluation Patient Details Name: PEARCE LITTLEFIELD MRN: [id number] DOB: Oct 01, 1932 Today's Date: 01/04/2012 Time: 0272-5366 OT Time Calculation (min): 23 min  OT Assessment / Plan / Recommendation Clinical Impression  Pt admitted with GI bleed and h/o Parkinson's. Pt to undergo GI procedure potentially tomorrow. Pt presents as generally weak and will benefit from skilled OT in the acute setting to maximize I with ADL and ADL mobility prior to d/c. Recommend HHOT at d/c pending pt progress    OT Assessment  Patient needs continued OT Services    Follow Up Recommendations  Home health OT;Supervision/Assistance - 24 hour    Barriers to Discharge      Equipment Recommendations  None recommended by OT    Recommendations for Other Services    Frequency  Min 2X/week    Precautions / Restrictions Precautions Precautions: Fall   Pertinent Vitals/Pain Pt with no c/o pain during eval.    ADL  Grooming: Wash/dry face;Set up;Supervision/safety Where Assessed - Grooming: Unsupported sitting Upper Body Bathing: Min guard Where Assessed - Upper Body Bathing: Supported sitting Lower Body Bathing: Minimal assistance Where Assessed - Lower Body Bathing: Supported sit to stand Upper Body Dressing: Minimal assistance Where Assessed - Upper Body Dressing: Unsupported sitting Lower Body Dressing: Minimal assistance Where Assessed - Lower Body Dressing: Supported sit to stand Toilet Transfer: Minimal assistance Toilet Transfer Method: Surveyor, minerals: Materials engineer and Hygiene: Moderate assistance Where Assessed - Toileting Clothing Manipulation and Hygiene: Standing Equipment Used: Gait belt Transfers/Ambulation Related to ADLs: Min A stand pivot from bed to bedside and then to chair    OT Diagnosis: Generalized weakness;Acute pain  OT Problem List: Decreased strength;Decreased activity tolerance;Impaired  balance (sitting and/or standing);Decreased knowledge of use of DME or AE;Decreased knowledge of precautions OT Treatment Interventions: Self-care/ADL training;Therapeutic activities;Patient/family education;Balance training   OT Goals Acute Rehab OT Goals OT Goal Formulation: With patient Time For Goal Achievement: 01/11/12 Potential to Achieve Goals: Good ADL Goals Pt Will Perform Grooming: with supervision;Standing at sink;Sitting at sink ADL Goal: Grooming - Progress: Goal set today Pt Will Perform Upper Body Dressing: with set-up;Sitting, chair;Sitting, bed ADL Goal: Upper Body Dressing - Progress: Goal set today Pt Will Perform Lower Body Dressing: Sit to stand from chair;with supervision;Sit to stand from bed ADL Goal: Lower Body Dressing - Progress: Goal set today Pt Will Transfer to Toilet: with supervision;Ambulation;with DME ADL Goal: Toilet Transfer - Progress: Goal set today Pt Will Perform Toileting - Clothing Manipulation: Independently;Standing ADL Goal: Toileting - Clothing Manipulation - Progress: Goal set today Pt Will Perform Toileting - Hygiene: with modified independence;Sitting on 3-in-1 or toilet ADL Goal: Toileting - Hygiene - Progress: Goal set today Pt Will Perform Tub/Shower Transfer: with min assist;Ambulation;with DME ADL Goal: Tub/Shower Transfer - Progress: Goal set today  Visit Information  Last OT Received On: 01/04/12 Assistance Needed: +1    Subjective Data  Subjective: No verbalizations but pt agreeable to getting cleaned up as condom cath had come off prior to therapy entering room Patient Stated Goal: None stated   Prior Functioning     Home Living Lives With: Spouse Available Help at Discharge: Family Type of Home: House Home Access: Level entry Home Layout: One level Bathroom Shower/Tub: Engineer, manufacturing systems: Standard Home Adaptive Equipment: Grab bars around toilet;Grab bars in shower;Tub transfer bench;Walker -  rolling;Bedside commode/3-in-1;Hand-held shower hose Prior Function Level of Independence: Independent with assistive device(s) Driving: No Vocation: Retired Musician: Expressive  difficulties         Vision/Perception Vision - Assessment Additional Comments: not assessed    Cognition  Overall Cognitive Status: Appears within functional limits for tasks assessed/performed Arousal/Alertness: Awake/alert Orientation Level: Appears intact for tasks assessed Behavior During Session: [facility] for tasks performed    Extremity/Trunk Assessment Right Upper Extremity Assessment RUE ROM/Strength/Tone: [facility] for tasks assessed Left Upper Extremity Assessment LUE ROM/Strength/Tone: WFL for tasks assessed     Mobility Bed Mobility Supine to Sit: 4: Min assist Sitting - Scoot to Edge of Bed: 3: Mod assist Details for Bed Mobility Assistance: assist to bring hips to EOB Transfers Sit to Stand: 4: Min assist;From bed;From chair/3-in-1 Stand to Sit: 4: Min assist;With armrests;To chair/3-in-1 Details for Transfer Assistance: VC for hand placement and assist to achieve full upright standing as well as to control descent to chair     Shoulder Instructions     Exercise     Balance     End of Session OT - End of Session Equipment Utilized During Treatment: Gait belt Activity Tolerance: Patient tolerated treatment well Patient left: in chair;with call bell/phone within reach;with chair alarm set;with family/visitor present;with nursing in room Nurse Communication: Mobility status  GO     Odean Mcelwain 01/04/2012, 5:18 PM

## 2012-01-04 NOTE — Op Note (Signed)
Procedure: Rigid sigmoidoscopy  Description of procedure: After discussing and explaining the procedure to the patient and his wife, rigid sigmoidoscopy is performed at the bedside to assess the extent and level of his recently discovered rectosigmoid mass. The rigid sigmoidoscope was advanced without difficulty to 14 cm where there was a circumferential partially obstructing exophytic mass consistent with a carcinoma. I did not attempt to advance beyond this. The remainder of the rectum appeared normal.

## 2012-01-04 NOTE — Progress Notes (Signed)
Patient interviewed and examined, agree with PA note above. Following rigid sigmoidoscopy today showing a partially obstructing lesion at 14 cm, I recommended proceeding with resection tomorrow. This was discussed in detail with his wife and with the patient present who appears to understand. We discussed the indications for the procedure and risks of bleeding, infection, anastomotic leak, and anesthetic complications. We discussed that a temporary diverting ileostomy could be performed depending on operative findings. He expresses understanding and his wife understands and desires to proceed. Mariella Saa MD, FACS  01/04/2012 7:25 PM

## 2012-01-05 ENCOUNTER — Encounter (HOSPITAL_COMMUNITY): Payer: Self-pay | Admitting: Anesthesiology

## 2012-01-05 ENCOUNTER — Encounter (HOSPITAL_COMMUNITY)
Admission: EM | Disposition: A | Discharge status: Home / Self Care | Payer: Self-pay | Source: Home / Self Care | Attending: Internal Medicine

## 2012-01-05 ENCOUNTER — Inpatient Hospital Stay (HOSPITAL_COMMUNITY): Payer: Medicare Other | Admitting: Anesthesiology

## 2012-01-05 DIAGNOSIS — C189 Malignant neoplasm of colon, unspecified: Secondary | ICD-10-CM

## 2012-01-05 DIAGNOSIS — K6289 Other specified diseases of anus and rectum: Secondary | ICD-10-CM | POA: Diagnosis present

## 2012-01-05 HISTORY — PX: BOWEL RESECTION: SHX1257

## 2012-01-05 SURGERY — RESECTION, RECTUM, LOW ANTERIOR
Anesthesia: General | Site: Abdomen | Wound class: Clean Contaminated

## 2012-01-05 MED ORDER — ESMOLOL HCL 10 MG/ML IV SOLN
INTRAVENOUS | Status: DC | PRN
  Administered 2012-01-05 (×2): 40 mg via INTRAVENOUS

## 2012-01-05 MED ORDER — LACTATED RINGERS IV SOLN
INTRAVENOUS | Status: DC | PRN
  Administered 2012-01-05 (×2): via INTRAVENOUS

## 2012-01-05 MED ORDER — OXYCODONE HCL 5 MG/5ML PO SOLN: 5.0000 mg | Freq: Once | ORAL | Status: DC | PRN

## 2012-01-05 MED ORDER — PROMETHAZINE HCL 25 MG/ML IJ SOLN: 6.2500 mg | INTRAMUSCULAR | Status: DC | PRN

## 2012-01-05 MED ORDER — HYDROMORPHONE HCL PF 1 MG/ML IJ SOLN: 0.2500 mg | INTRAMUSCULAR | Status: DC | PRN

## 2012-01-05 MED ORDER — HYDROMORPHONE HCL PF 1 MG/ML IJ SOLN
INTRAMUSCULAR | Status: DC | PRN
  Administered 2012-01-05: .25 mg via INTRAVENOUS

## 2012-01-05 MED ORDER — PROPOFOL 10 MG/ML IV BOLUS
INTRAVENOUS | Status: DC | PRN
  Administered 2012-01-05 (×2): 70 mg via INTRAVENOUS
  Administered 2012-01-05: 60 mg via INTRAVENOUS

## 2012-01-05 MED ORDER — SODIUM CHLORIDE 0.9 % IV SOLN
1.0000 g | INTRAVENOUS | Status: AC
  Administered 2012-01-05: 1 g via INTRAVENOUS
  Filled 2012-01-05: qty 1

## 2012-01-05 MED ORDER — MEPERIDINE HCL 25 MG/ML IJ SOLN: 6.2500 mg | INTRAMUSCULAR | Status: DC | PRN

## 2012-01-05 MED ORDER — MORPHINE SULFATE 2 MG/ML IJ SOLN
2.0000 mg | INTRAMUSCULAR | Status: DC | PRN
  Administered 2012-01-06: 2 mg via INTRAVENOUS
  Filled 2012-01-05: qty 1

## 2012-01-05 MED ORDER — OXYCODONE HCL 5 MG PO TABS: 5.0000 mg | ORAL_TABLET | Freq: Once | ORAL | Status: DC | PRN

## 2012-01-05 MED ORDER — LACTATED RINGERS IV SOLN
INTRAVENOUS | Status: DC
  Administered 2012-01-05: 11:00:00 via INTRAVENOUS

## 2012-01-05 MED ORDER — SUCCINYLCHOLINE CHLORIDE 20 MG/ML IJ SOLN
INTRAMUSCULAR | Status: DC | PRN
  Administered 2012-01-05: 80 mg via INTRAVENOUS

## 2012-01-05 MED ORDER — ACETAMINOPHEN 10 MG/ML IV SOLN
INTRAVENOUS | Status: AC
  Filled 2012-01-05: qty 100

## 2012-01-05 MED ORDER — 0.9 % SODIUM CHLORIDE (POUR BTL) OPTIME
TOPICAL | Status: DC | PRN
  Administered 2012-01-05 (×2): 1000 mL

## 2012-01-05 MED ORDER — LIDOCAINE HCL (CARDIAC) 20 MG/ML IV SOLN
INTRAVENOUS | Status: DC | PRN
  Administered 2012-01-05: 60 mg via INTRAVENOUS

## 2012-01-05 MED ORDER — GLYCOPYRROLATE 0.2 MG/ML IJ SOLN
INTRAMUSCULAR | Status: DC | PRN
  Administered 2012-01-05: .8 mg via INTRAVENOUS

## 2012-01-05 MED ORDER — NEOSTIGMINE METHYLSULFATE 1 MG/ML IJ SOLN
INTRAMUSCULAR | Status: DC | PRN
  Administered 2012-01-05: 5 mg via INTRAVENOUS

## 2012-01-05 MED ORDER — ACETAMINOPHEN 10 MG/ML IV SOLN
INTRAVENOUS | Status: DC | PRN
  Administered 2012-01-05: 1000 mg via INTRAVENOUS

## 2012-01-05 MED ORDER — DEXTROSE 5 % IV SOLN
INTRAVENOUS | Status: DC | PRN
  Administered 2012-01-05 (×2): via INTRAVENOUS

## 2012-01-05 MED ORDER — KCL IN DEXTROSE-NACL 20-5-0.9 MEQ/L-%-% IV SOLN
INTRAVENOUS | Status: DC
  Administered 2012-01-05 – 2012-01-09 (×8): via INTRAVENOUS
  Filled 2012-01-05 (×9): qty 1000

## 2012-01-05 MED ORDER — ONDANSETRON HCL 4 MG/2ML IJ SOLN
INTRAMUSCULAR | Status: DC | PRN
  Administered 2012-01-05: 4 mg via INTRAVENOUS

## 2012-01-05 MED ORDER — ARTIFICIAL TEARS OP OINT
TOPICAL_OINTMENT | OPHTHALMIC | Status: DC | PRN
  Administered 2012-01-05: 1 via OPHTHALMIC

## 2012-01-05 MED ORDER — FENTANYL CITRATE 0.05 MG/ML IJ SOLN
INTRAMUSCULAR | Status: DC | PRN
  Administered 2012-01-05: 25 ug via INTRAVENOUS
  Administered 2012-01-05: 175 ug via INTRAVENOUS
  Administered 2012-01-05: 50 ug via INTRAVENOUS

## 2012-01-05 MED ORDER — ACETAMINOPHEN 10 MG/ML IV SOLN: 1000.0000 mg | Freq: Once | INTRAVENOUS | Status: DC

## 2012-01-05 MED ORDER — ROCURONIUM BROMIDE 100 MG/10ML IV SOLN
INTRAVENOUS | Status: DC | PRN
  Administered 2012-01-05: 50 mg via INTRAVENOUS

## 2012-01-05 SURGICAL SUPPLY — 61 items
BLADE SURG ROTATE 9660 (MISCELLANEOUS) ×2 IMPLANT
CANISTER SUCTION 2500CC (MISCELLANEOUS) ×2 IMPLANT
CHLORAPREP W/TINT 26ML (MISCELLANEOUS) ×2 IMPLANT
CLOTH BEACON ORANGE TIMEOUT ST (SAFETY) ×2 IMPLANT
COVER SURGICAL LIGHT HANDLE (MISCELLANEOUS) ×2 IMPLANT
DRAPE INCISE IOBAN 66X45 STRL (DRAPES) IMPLANT
DRAPE LAPAROSCOPIC ABDOMINAL (DRAPES) ×2 IMPLANT
DRAPE PROXIMA HALF (DRAPES) ×6 IMPLANT
DRAPE UTILITY 15X26 W/TAPE STR (DRAPE) ×4 IMPLANT
DRAPE WARM FLUID 44X44 (DRAPE) ×2 IMPLANT
ELECT BLADE 6.5 EXT (BLADE) ×2 IMPLANT
ELECT CAUTERY BLADE 6.4 (BLADE) ×4 IMPLANT
ELECT REM PT RETURN 9FT ADLT (ELECTROSURGICAL) ×2
ELECTRODE REM PT RTRN 9FT ADLT (ELECTROSURGICAL) ×1 IMPLANT
GLOVE BIO SURGEON STRL SZ7.5 (GLOVE) ×4 IMPLANT
GLOVE BIO SURGEON STRL SZ8 (GLOVE) ×8 IMPLANT
GLOVE BIOGEL PI IND STRL 7.0 (GLOVE) ×1 IMPLANT
GLOVE BIOGEL PI IND STRL 7.5 (GLOVE) ×1 IMPLANT
GLOVE BIOGEL PI IND STRL 8 (GLOVE) ×4 IMPLANT
GLOVE BIOGEL PI INDICATOR 7.0 (GLOVE) ×1
GLOVE BIOGEL PI INDICATOR 7.5 (GLOVE) ×1
GLOVE BIOGEL PI INDICATOR 8 (GLOVE) ×4
GLOVE ECLIPSE 7.0 STRL STRAW (GLOVE) ×2 IMPLANT
GLOVE SS BIOGEL STRL SZ 7.5 (GLOVE) ×3 IMPLANT
GLOVE SUPERSENSE BIOGEL SZ 7.5 (GLOVE) ×3
GOWN STRL NON-REIN LRG LVL3 (GOWN DISPOSABLE) ×4 IMPLANT
GOWN STRL REIN XL XLG (GOWN DISPOSABLE) ×8 IMPLANT
KIT BASIN OR (CUSTOM PROCEDURE TRAY) ×2 IMPLANT
KIT ROOM TURNOVER OR (KITS) ×2 IMPLANT
LEGGING LITHOTOMY PAIR STRL (DRAPES) ×2 IMPLANT
LIGASURE IMPACT 36 18CM CVD LR (INSTRUMENTS) IMPLANT
NS IRRIG 1000ML POUR BTL (IV SOLUTION) ×4 IMPLANT
PACK GENERAL/GYN (CUSTOM PROCEDURE TRAY) ×2 IMPLANT
PAD ARMBOARD 7.5X6 YLW CONV (MISCELLANEOUS) ×2 IMPLANT
PENCIL BUTTON HOLSTER BLD 10FT (ELECTRODE) ×2 IMPLANT
RETRACTOR WND ALEXIS 25 LRG (MISCELLANEOUS) ×1 IMPLANT
RTRCTR WOUND ALEXIS 25CM LRG (MISCELLANEOUS) ×2
SEPRAFILM PROCEDURAL PACK 3X5 (MISCELLANEOUS) ×2 IMPLANT
SHEARS HARMONIC 23CM COAG (MISCELLANEOUS) ×2 IMPLANT
SPONGE GAUZE 4X4 12PLY (GAUZE/BANDAGES/DRESSINGS) ×2 IMPLANT
SPONGE LAP 18X18 X RAY DECT (DISPOSABLE) IMPLANT
STAPLER CIRC CVD 29MM 37CM (STAPLE) ×2 IMPLANT
STAPLER CUT CVD 40MM BLUE (STAPLE) ×2 IMPLANT
STAPLER PROXIMATE 75MM BLUE (STAPLE) ×2 IMPLANT
STAPLER VISISTAT 35W (STAPLE) ×2 IMPLANT
SUCTION POOLE TIP (SUCTIONS) ×2 IMPLANT
SURGILUBE 2OZ TUBE FLIPTOP (MISCELLANEOUS) ×2 IMPLANT
SUT PDS AB 1 TP1 96 (SUTURE) ×4 IMPLANT
SUT PROLENE 2 0 CT2 30 (SUTURE) IMPLANT
SUT PROLENE 2 0 KS (SUTURE) IMPLANT
SUT SILK 2 0 SH CR/8 (SUTURE) ×2 IMPLANT
SUT SILK 2 0 TIES 10X30 (SUTURE) ×2 IMPLANT
SUT SILK 3 0 SH CR/8 (SUTURE) ×2 IMPLANT
SUT SILK 3 0 TIES 10X30 (SUTURE) ×2 IMPLANT
TAPE CLOTH SURG 6X10 WHT LF (GAUZE/BANDAGES/DRESSINGS) ×2 IMPLANT
TOWEL OR 17X24 6PK STRL BLUE (TOWEL DISPOSABLE) ×2 IMPLANT
TOWEL OR 17X26 10 PK STRL BLUE (TOWEL DISPOSABLE) ×2 IMPLANT
TRAY FOLEY CATH 14FRSI W/METER (CATHETERS) ×2 IMPLANT
TRAY PROCTOSCOPIC FIBER OPTIC (SET/KITS/TRAYS/PACK) ×2 IMPLANT
WATER STERILE IRR 1000ML POUR (IV SOLUTION) IMPLANT
YANKAUER SUCT BULB TIP NO VENT (SUCTIONS) ×2 IMPLANT

## 2012-01-05 NOTE — Progress Notes (Signed)
NUTRITION FOLLOW UP  Intervention:   1. Continue Magic Cups once diet advanced post procedure 2. RD will continue to follow    Nutrition Dx:   Predicted sub-optimal intake related to change in environment as evidenced by hx of weight loss. New dx: Inadequate oral intake related to diet provisions as evidenced by clear liquid or full liquid diet x 6 days   Goal:   PO intake to meet>/=90% estimated nutrition needs   Monitor:   PO intake, diet advance, labs  Assessment:   Rectal mass found on colonoscopy, nearly obstructing colon. Pt has been on a CL or FL diet most of admission. Pt does not like Ensure or other nutrition shakes. Will continue to use Magic Cup TID. S/p sigmoidoscopy 12/16.  Height: Ht Readings from Last 1 Encounters:  12/30/11 5\' 8"  (1.727 m)    Weight Status:   Wt Readings from Last 1 Encounters:  12/30/11 138 lb 7.2 oz (62.8 kg)    Re-estimated needs:  Kcal: 1550-1700 Protein: 60-70 gm  Fluid: 1.5-1.7 L    Skin: New stage I on coccyx   Diet Order: NPO   Intake/Output Summary (Last 24 hours) at 01/05/12 0938 Last data filed at 01/05/12 0649  Gross per 24 hour  Intake   2269 ml  Output    275 ml  Net   1994 ml    Last BM: 12/14   Labs:   Lab 01/04/12 0500 01/03/12 0610 12/31/11 0555  NA 138 138 137  K 3.7 3.4* 4.3  CL 106 106 105  CO2 23 24 25   BUN 9 9 11   CREATININE 0.63 0.61 0.58  CALCIUM 8.3* 8.2* 8.1*  MG -- -- --  PHOS -- -- --  GLUCOSE 86 80 81   CBG (last 3)  No results found for this basename: GLUCAP:3 in the last 72 hours  Scheduled Meds:   . levofloxacin (LEVAQUIN) IV  750 mg Intravenous Q24H  . pantoprazole (PROTONIX) IV  40 mg Intravenous Q12H  . sodium chloride  3 mL Intravenous Q12H   Continuous Infusions:   . sodium chloride 500 mL (01/01/12 0754)       Clarene Duke RD, LDN Pager [phone] After Hours pager [phone]

## 2012-01-05 NOTE — Anesthesia Preprocedure Evaluation (Addendum)
Anesthesia Evaluation  Patient identified by MRN, date of birth, ID band Patient awake    Reviewed: Allergy & Precautions, H&P , NPO status , Patient's Chart, lab work & pertinent test results  Airway Mallampati: II  Neck ROM: Full    Dental  (+) Edentulous Upper and Edentulous Lower   Pulmonary neg pulmonary ROS,   PORTABLE CHEST - 1 VIEW   Comparison: CT scan dated 01/02/2012 and chest x-ray dated 04/24/2011   Findings: Heart size and vascularity are within normal limits. Tiny bilateral effusions.  Faint small infiltrates at both lung bases which correlate with the abnormalities seen on chest CT.  No progression.   IMPRESSION: Tiny effusions and small bilateral lower lobe infiltrates posteriorly. 01-03-12 breath sounds clear to auscultation        Cardiovascular Exercise Tolerance: Poor negative cardio ROS  Rhythm:Regular Rate:Normal + Systolic murmurs [id number] 10:33:52 [facility]  Sinus bradycardia Nonspecific ST and T wave abnormality Abnormal ECGConclusions  - Left ventricle: The cavity size was normal. Systolic   function was normal. The estimated ejection fraction was   in the range of 55% to 60%. Although no diagnostic   regional wall motion abnormality was identified, this   possibility cannot be completely excluded on the basis of   this study. - Mitral valve: Calcified annulus. Mildly thickened leaflets   .      Neuro/Psych Impaired speech and more emotional per pt's wife who is @ his bedside.  Parkinson's disease CVA, Residual Symptoms    GI/Hepatic negative GI ROS, Neg liver ROS, See surgeons note    Endo/Other  negative endocrine ROS  Renal/GU negative Renal ROS     Musculoskeletal negative musculoskeletal ROS (+) Arthritis -, Osteoarthritis,    Abdominal   Peds  Hematology  (+) Blood dyscrasia, anemia ,   Anesthesia Other Findings   Reproductive/Obstetrics                        Anesthesia Physical Anesthesia Plan  ASA: III  Anesthesia Plan: General   Post-op Pain Management:    Induction: Intravenous  Airway Management Planned: Oral ETT  Additional Equipment:   Intra-op Plan:   Post-operative Plan: Extubation in OR  Informed Consent: I have reviewed the patients History and Physical, chart, labs and discussed the procedure including the risks, benefits and alternatives for the proposed anesthesia with the patient or authorized representative who has indicated his/her understanding and acceptance.   Dental advisory given  Plan Discussed with: CRNA and Surgeon  Anesthesia Plan Comments:         Anesthesia Quick Evaluation

## 2012-01-05 NOTE — Preoperative (Signed)
Beta Blockers   Reason not to administer Beta Blockers:Not Applicable 

## 2012-01-05 NOTE — Anesthesia Procedure Notes (Signed)
Procedure Name: Intubation Date/Time: 01/05/2012 12:33 PM Performed by: Tyrone Nine Pre-anesthesia Checklist: Patient identified, Emergency Drugs available, Suction available, Patient being monitored and Timeout performed Patient Re-evaluated:Patient Re-evaluated prior to inductionOxygen Delivery Method: Circle system utilized Preoxygenation: Pre-oxygenation with 100% oxygen Intubation Type: IV induction Ventilation: Mask ventilation without difficulty Laryngoscope Size: Mac and 3 Grade View: Grade I Tube size: 8.0 mm Number of attempts: 1 Airway Equipment and Method: Stylet Placement Confirmation: ETT inserted through vocal cords under direct vision and breath sounds checked- equal and bilateral Secured at: 22 cm Tube secured with: Tape Dental Injury: Teeth and Oropharynx as per pre-operative assessment

## 2012-01-05 NOTE — Anesthesia Postprocedure Evaluation (Signed)
  Anesthesia Post-op Note  Patient: Cole Cunningham  Procedure(s) Performed: Procedure(s) (LRB) with comments: LOW ANTERIOR BOWEL RESECTION (N/A)  Patient Location: PACU  Anesthesia Type:General  Level of Consciousness: awake and sedated  Airway and Oxygen Therapy: Patient Spontanous Breathing  Post-op Pain: mild  Post-op Assessment: Post-op Vital signs reviewed, Patient's Cardiovascular Status Stable and Respiratory Function Stable  Post-op Vital Signs: stable  Complications: No apparent anesthesia complications

## 2012-01-05 NOTE — Transfer of Care (Signed)
Immediate Anesthesia Transfer of Care Note  Patient: Cole Cunningham  Procedure(s) Performed: Procedure(s) (LRB) with comments: LOW ANTERIOR BOWEL RESECTION (N/A)  Patient Location: PACU  Anesthesia Type:General  Level of Consciousness: awake, alert , oriented, sedated and patient cooperative  Airway & Oxygen Therapy: Patient Spontanous Breathing and Patient connected to face mask oxygen  Post-op Assessment: Report given to PACU RN and Post -op Vital signs reviewed and stable  Post vital signs: Reviewed and stable  Complications: No apparent anesthesia complications

## 2012-01-05 NOTE — Progress Notes (Signed)
[facility] PROGRESS NOTE  Cole Cunningham ZOX:[id number] DOB: 30-May-1932 DOA: 12/30/2011 PCP: Claude Manges, FNP  Assessment/Plan:   #1.Rectal mass-nearly obstructing, likely cancer per GI -Status post colonoscopy 12/14 revealed rectal mass -CT scan of abdomen and pelvis with no definite evidence of metastatic disease, CEA 1.7(wnl) -per surgery note bedside sigmoidoscopy 12/16 per surgery -s/p surgical resection today, follow path -surgery to follow for further recs #2. Symptomatic severe anemia secondary to GI bleed -secondary to #1, status post transfusion of 2 units of packed red blood cells on admission, but hemoglobin was 6.6 12/13- was transfused another 2 units of packed red blood cells and hgb improved to 10.3 12/14, hgb 9.3 12/16, remaining stable. -EGD negative, appreciate GI assistance, colonoscopy as above #3. Hypokalemia - probably from poor oral intake. Resolved #4. History of Parkinson's disease - continue home medications.  #5 leukocytosis/Probable PNA  -CT scan of abdomen and pelvis with bilateral lower airspace disease suspicious for pulmonary edema or pneumonia  -continue empiric antibiotics and follow. -BNP elevated, echo with EF 55-60%, CXR with bilateral lobe infiltrates.  -continue current abx- recommend x7days, leukocytosis resolved   Code Status: FULL Family Communication: Wife at bedside Disposition Plan: home when medically ready with hh   Consultants:  GI consulted-eval pending  Procedures:  EGD 12/13-negative -Status post colonoscopy 12/14 revealed rectal mass -s/p rigid sigmoidoscopy at bedside 12/16 per surgery   Antibiotics:  levaquin started on 12/14  HPI/Subjective: S/p sugery, seen in the PACU- sleepy but easily aroused- denies any new complaints.  Objective: Filed Vitals:   01/04/12 1340 01/04/12 1407 01/04/12 2100 01/05/12 0619  BP: 143/52 141/41 147/54 142/57  Pulse: 71 69 67 82  Temp: 98.2 F (36.8 C) 98.2 F  (36.8 C) 98 F (36.7 C) 98 F (36.7 C)  TempSrc: Oral Oral Oral Oral  Resp: 20 20 16 17   Height:      Weight:      SpO2: 94% 97% 98% 99%    Intake/Output Summary (Last 24 hours) at 01/05/12 1608 Last data filed at 01/05/12 1430  Gross per 24 hour  Intake   3179 ml  Output    775 ml  Net   2404 ml   Filed Weights   12/30/11 2246  Weight: 62.8 kg (138 lb 7.2 oz)    Exam:   General:  Sleepy but easily aroused,in NAD  Cardiovascular: Regular rate and rhythm, normal S1-S2  Respiratory: Decreased breath sounds at bases, otherwise clear to auscultation  Abdomen: Soft, decreased bowel sounds present midline with dressing clean and dry.   Data Reviewed: Basic Metabolic Panel:  Lab 01/04/12 8119 01/03/12 0610 12/31/11 0555 12/30/11 1943  NA 138 138 137 140  K 3.7 3.4* 4.3 2.9*  CL 106 106 105 105  CO2 23 24 25 26   GLUCOSE 86 80 81 94  BUN 9 9 11 13   CREATININE 0.63 0.61 0.58 0.66  CALCIUM 8.3* 8.2* 8.1* 8.4  MG -- -- -- --  PHOS -- -- -- --   Liver Function Tests:  Lab 12/31/11 0555 12/30/11 1943  AST 21 19  ALT 10 13  ALKPHOS 98 108  BILITOT 2.9* 0.7  PROT 5.3* 5.8*  ALBUMIN 2.6* 2.9*   No results found for this basename: LIPASE:5,AMYLASE:5 in the last 168 hours No results found for this basename: AMMONIA:5 in the last 168 hours CBC:  Lab 01/04/12 0500 01/03/12 0610 01/02/12 0522 01/01/12 0430 12/31/11 1532 12/31/11 0555  WBC 5.2 6.3 13.9* 6.7 -- 4.6  NEUTROABS -- -- -- -- -- --  HGB 9.3* 9.2* 10.3* 6.6* 7.2* --  HCT 31.8* 31.1* 33.9* 23.0* 25.4* --  MCV 74.1* 72.3* 70.3* 65.9* -- 66.7*  PLT 208 192 230 217 -- 230   Cardiac Enzymes: No results found for this basename: CKTOTAL:5,CKMB:5,CKMBINDEX:5,TROPONINI:5 in the last 168 hours BNP (last 3 results)  Basename 01/02/12 0522  PROBNP 1043.0*   CBG: No results found for this basename: GLUCAP:5 in the last 168 hours  Recent Results (from the past 240 hour(s))  SURGICAL PCR SCREEN     Status: Normal    Collection Time   01/04/12  6:08 PM      Component Value Range Status Comment   MRSA, PCR NEGATIVE  NEGATIVE Final    Staphylococcus aureus NEGATIVE  NEGATIVE Final      Studies: Dg Chest Port 1 View  01/03/2012  *RADIOLOGY REPORT*  Clinical Data: Pulmonary infiltrates.  PORTABLE CHEST - 1 VIEW  Comparison: CT scan dated 01/02/2012 and chest x-ray dated 04/24/2011  Findings: Heart size and vascularity are within normal limits. Tiny bilateral effusions.  Faint small infiltrates at both lung bases which correlate with the abnormalities seen on chest CT.  No progression.  IMPRESSION: Tiny effusions and small bilateral lower lobe infiltrates posteriorly.   Original Report Authenticated By: Francene Boyers, M.D.     Scheduled Meds:    . [MAR HOLD] levofloxacin (LEVAQUIN) IV  750 mg Intravenous Q24H  . [MAR HOLD] pantoprazole (PROTONIX) IV  40 mg Intravenous Q12H  . [facility] HOLD] sodium chloride  3 mL Intravenous Q12H   Continuous Infusions:    . sodium chloride 20 mL/hr at 01/05/12 1008  . lactated ringers 50 mL/hr at 01/05/12 1107    Principal Problem:  *Anemia Active Problems:  GI bleed  Parkinson's disease  Hypokalemia  Leukocytosis    Time spent: 30    [facility] C  [facility] Pager [phone]. If 8PM-8AM, please contact night-coverage at [email or web address], password [facility] 01/05/2012, 4:08 PM  LOS: 6 days

## 2012-01-05 NOTE — Addendum Note (Signed)
Addendum  created 01/05/12 1654 by Josepha Pigg, MD   Modules edited:Anesthesia Events

## 2012-01-05 NOTE — Progress Notes (Signed)
ANTIBIOTIC CONSULT NOTE - FOLLOW UP  Pharmacy Consult:  Levaquin Indication:  PNA  No Known Allergies  Patient Measurements: Height: 5\' 8"  (172.7 cm) Weight: 138 lb 7.2 oz (62.8 kg) IBW/kg (Calculated) : 68.4   Vital Signs: Temp: 98 F (36.7 C) (12/17 0619) Temp src: Oral (12/17 0619) BP: 142/57 mmHg (12/17 0619) Pulse Rate: 82  (12/17 0619) Intake/Output from previous day: 12/16 0701 - 12/17 0700 In: 2269 [P.O.:598; I.V.:1671] Out: 275 [Urine:275]  Labs:  [facility] 01/04/12 0500 01/03/12 0610  WBC 5.2 6.3  HGB 9.3* 9.2*  PLT 208 192  LABCREA -- --  CREATININE 0.63 0.61   Estimated Creatinine Clearance: 68.7 ml/min (by C-G formula based on Cr of 0.63). No results found for this basename: VANCOTROUGH:2,VANCOPEAK:2,VANCORANDOM:2,GENTTROUGH:2,GENTPEAK:2,GENTRANDOM:2,TOBRATROUGH:2,TOBRAPEAK:2,TOBRARND:2,AMIKACINPEAK:2,AMIKACINTROU:2,AMIKACIN:2, in the last 72 hours   Microbiology: Recent Results (from the past 720 hour(s))  SURGICAL PCR SCREEN     Status: Normal   Collection Time   01/04/12  6:08 PM      Component Value Range Status Comment   MRSA, PCR NEGATIVE  NEGATIVE Final    Staphylococcus aureus NEGATIVE  NEGATIVE Final        Assessment: 62 YOM admitted with GIB started Levaquin for possible PNA.  Patient's renal function has been stable.    Plan:  - Continue Levaquin 750mg  IV Q24H - Monitor renal function, clinical course - F/U iron replacement, surgical plans for carcinoma    Raliyah Montella D. Laney Potash, PharmD, BCPS Pager:  [phone] 01/05/2012, 8:50 AM

## 2012-01-05 NOTE — Op Note (Signed)
Preoperative Diagnosis: Adenocarcinoma rectosigmiod  Postoprative Diagnosis:Same  Procedure: Procedure(s): LOW ANTERIOR BOWEL RESECTION RECTOSIGMOID   Surgeon: Glenna Fellows T   Assistants: Violeta Gelinas M.D.  Anesthesia:  General endotracheal anesthesiaDiagnos  Indications:   Patient is a 78 year old male who presented with severe anemia and on workup was found to have a partially obstructing circumferential mass in the rectosigmoid. Biopsy has revealed invasive adenocarcinoma. On rigid sigmoidoscopy it is at about 13 cm. I've recommended proceeding with low anterior resection. The procedure was discussed with the patient's wife and the patient in detail and risks discussed and understood detailed elsewhere. He is now brought to the operating room for this procedure.  Procedure Detail:  Patient is brought to the operating room, placed in supine position on the operating table and general endotracheal anesthesia induced. Foley catheter was placed. He was carefully positioned and padded in yellowfin stirrups. The abdomen was widely sterilely prepped and draped. He received broad-spectrum preoperative antibiotics. The patient, was performed and correct procedure verified. A low midline incision carried to just above the umbilicus was used and dissection carried down through the subcutaneous tissue midline fascia and the peritoneum entered under direct vision. A thorough exploration was performed. The mass was palpable about 2 cm above the peritoneal reflection. There were some adhesions of a redundant sigmoid colon to the lateral more superior colon and abdominal wall. These were completely taken down mobilizing the sigmoid colon which was long and redundant. There were a few interloop adhesions among small bowel that appeared normal. Retroperitoneum was unremarkable. Liver felt normal. There were gallstones in uninflamed gallbladder. The small bowel was packed into the upper abdomen and a  Balfour retractor placed. The sigmoid and rectosigmoid were fully mobilized dividing lateral peritoneal attachments and divided the peritoneum along either side of the rectum and anterior to the rectum. The left ureter was identified and carefully protected throughout the remainder of the dissection. A point of division of the sigmoid colon was chosen and was divided with a single fire of the GIA 75 mm stapler. The mesentery of the sigmoid and rectosigmoid was then sequentially taken with the harmonic scalpel and the inferior mesenteric pedicle was taken between clamps and tied with 0 silk ties. Dissection was carried down onto the sacrum and into the previous presacral space. Lateral rectal pedicles were isolated and divided with the Harmonic scalpel working into the retroperitoneum. Dissection was carried in a plane to keep the knees her rectum intact around the tumor. Dissection was carried distally to the pelvis until we were possibly 5 or 6 cm distal to the tumor. At this point the rectum was cleared of mesentery with the harmonic scalpel. The rectum was divided at this point with the contour stapler. There was at least a 5 cm gross distal margin. The specimen was sent for permanent pathology. The end of the sigmoid colon was prepared for anastomosis clearing of the mesentery and pericolic fat for about a centimeter and then it was clamped with a pursestring clamp and a 2-0 Prolene pursestring placed in the staple line excised. The pursestring was intact. The end of the sigmoid was sized to 29 mm. A 29 mm EEA stapler was then introduced transanally Thompson and passed up to the rectal staple line and under direct vision the spike was deployed just anterior to the staple line near its midpoint. The stapler was attached to the anvil and closed excluding all extraneous tissue and fired and removed. Both donuts were thick and intact. Rigid sigmoidoscopy  was then performed in the rectum tensely distended with air  and the sigmoid clamp just above the anastomosis under saline irrigation there was no evidence of leak. There was no tension. Following this all gloves and instruments were changed and the wound protector removed. The abdomen was irrigated with saline and hemostasis assured. The viscera were returned to their anatomic position. Seprafilm was used underneath the incision and the fascia and peritoneum were closed with running looped #1 PDS begun at either end of the incision and tied centrally. The subcutaneous stitch was irrigated and skin closed with staples. The sponge needle and instrument counts were correct.   Estimated Blood Loss:  less than 100 mL         Drains: none  Blood Given: none          Specimens: sigmoid and rectum        Complications:  * No complications entered in OR log *         Disposition: PACU - hemodynamically stable.         Condition: stable  Mariella Saa MD, FACS  01/05/2012, 2:59 PM

## 2012-01-06 DIAGNOSIS — C189 Malignant neoplasm of colon, unspecified: Secondary | ICD-10-CM | POA: Diagnosis not present

## 2012-01-06 LAB — BASIC METABOLIC PANEL
Calcium: 8.1 mg/dL — ABNORMAL LOW (ref 8.4–10.5)
GFR calc non Af Amer: >90 mL/min (ref >90)
Glucose, Bld: 102 mg/dL — ABNORMAL HIGH (ref 70–99)
Sodium: 136 mEq/L (ref 135–145)

## 2012-01-06 LAB — CBC
MCH: 22.5 pg — ABNORMAL LOW (ref 26.0–34.0)
MCHC: 29.3 g/dL — ABNORMAL LOW (ref 30.0–36.0)
Platelets: 167 10*3/uL (ref 150–400)

## 2012-01-06 MED ORDER — PANTOPRAZOLE SODIUM 40 MG IV SOLR
40.0000 mg | INTRAVENOUS | Status: DC
  Administered 2012-01-06 – 2012-01-08 (×3): 40 mg via INTRAVENOUS
  Filled 2012-01-06 (×4): qty 40

## 2012-01-06 MED ORDER — MORPHINE SULFATE 2 MG/ML IJ SOLN
2.0000 mg | INTRAMUSCULAR | Status: DC | PRN
  Administered 2012-01-06 – 2012-01-07 (×7): 2 mg via INTRAVENOUS
  Administered 2012-01-08 (×2): 4 mg via INTRAVENOUS
  Administered 2012-01-08: 2 mg via INTRAVENOUS
  Administered 2012-01-08 – 2012-01-09 (×3): 4 mg via INTRAVENOUS
  Administered 2012-01-09 – 2012-01-10 (×2): 2 mg via INTRAVENOUS
  Filled 2012-01-06 (×2): qty 1
  Filled 2012-01-06: qty 2
  Filled 2012-01-06: qty 1
  Filled 2012-01-06 (×3): qty 2
  Filled 2012-01-06: qty 1
  Filled 2012-01-06: qty 2
  Filled 2012-01-06 (×2): qty 1
  Filled 2012-01-06: qty 2
  Filled 2012-01-06 (×3): qty 1

## 2012-01-06 MED ORDER — HEPARIN SODIUM (PORCINE) 5000 UNIT/ML IJ SOLN
5000.0000 [IU] | Freq: Three times a day (TID) | INTRAMUSCULAR | Status: DC
Start: 2012-01-06 — End: 2012-01-10
  Administered 2012-01-06 – 2012-01-10 (×12): 5000 [IU] via SUBCUTANEOUS
  Filled 2012-01-06 (×15): qty 1

## 2012-01-06 MED ORDER — WHITE PETROLATUM GEL
Status: AC
  Filled 2012-01-06: qty 5

## 2012-01-06 NOTE — Progress Notes (Signed)
1 Day Post-Op  Subjective: appeatrs to be in NAD, + for abdominal pain from surgery. (have asked nurses to frequently assess  secondary to his non verbal status)  Objective: Vital signs in last 24 hours: Temp:  [97.5 F (36.4 C)-98.3 F (36.8 C)] 98.3 F (36.8 C) (12/18 0552) Pulse Rate:  [55-99] 77  (12/18 0552) Resp:  [12-18] 18  (12/18 0552) BP: (138-182)/(46-92) 145/72 mmHg (12/18 0552) SpO2:  [95 %-100 %] 97 % (12/18 0552) FiO2 (%):  [2 %] 2 % (12/18 0552) Last BM Date: 01/02/12  Intake/Output from previous day: 12/17 0701 - 12/18 0700 In: 2879.2 [I.V.:2879.2] Out: 1000 [Urine:950; Blood:50] Intake/Output this shift:    General appearance: alert, cooperative, appears stated age and mild distress Chest: CTA Cardiac: RRR Abdomen: tender over surgical incision, dressing is C/D/I, abdomen is soft, No BS. Extremities: warm to touch, no edema or tenderness, + pulses. Labs: WBC pend, H&H stable VSS, afebrile  Lab Results:   Basename 01/06/12 0600 01/04/12 0500  WBC PENDING 5.2  HGB 9.6* 9.3*  HCT 32.8* 31.8*  PLT PENDING 208   BMET  Basename 01/04/12 0500  NA 138  K 3.7  CL 106  CO2 23  GLUCOSE 86  BUN 9  CREATININE 0.63  CALCIUM 8.3*   PT/INR No results found for this basename: LABPROT:2,INR:2 in the last 72 hours ABG No results found for this basename: PHART:2,PCO2:2,PO2:2,HCO3:2 in the last 72 hours  Studies/Results: No results found.  Anti-infectives: Anti-infectives     Start     Dose/Rate Route Frequency Ordered Stop   01/05/12 1300   ertapenem (INVANZ) 1 g in sodium chloride 0.9 % 50 mL IVPB        1 g 100 mL/hr over 30 Minutes Intravenous To Surgery 01/05/12 1246 01/05/12 1253   01/02/12 2000   levofloxacin (LEVAQUIN) IVPB 750 mg        750 mg 100 mL/hr over 90 Minutes Intravenous Every 24 hours 01/02/12 1851            Assessment/Plan: s/p Procedure(s) (LRB) with comments: LOW ANTERIOR BOWEL RESECTION (N/A)  Patient Active Problem  List  Diagnosis  . Anemia  . GI bleed  . Parkinson's disease  . Hypokalemia  . Leukocytosis  . Rectal mass  management per medicine for his other medical needs Continue NPO, pain management, IVF OOB Continue to follow clinical picture.   LOS: 7 days    Golda Acre [facility] Pager # [phone] 01/06/2012

## 2012-01-06 NOTE — Plan of Care (Signed)
Problem: Phase I Progression Outcomes Goal: EF % per last Echo/documented,Core Reminder form on chart Outcome: Completed/Met Date Met:  01/06/12 55% to 60%

## 2012-01-06 NOTE — Progress Notes (Signed)
[facility] PROGRESS NOTE  Cole Cunningham JYN:[id number] DOB: Jan 31, 1932 DOA: 12/30/2011 PCP: Claude Manges, FNP  Assessment/Plan: #1.Adenocarcinoma, rectosigmoid, Rectal mass-nearly obstructing -CT scan of abdomen and pelvis with no definite evidence of metastatic disease, CEA 1.7(wnl) -s/p Low anterior bowel resection 12/17 -surgery to follow for further recs  #2. Symptomatic severe anemia secondary to GI bleed -secondary to #1, status post transfusion of 2 units of packed red blood cells on admission, but hemoglobin was 6.6 12/13- was transfused another 2 units of packed red blood cells and hgb improved to 10.3 12/14, hgb 9.3 12/16, remaining stable. -EGD negative, appreciate GI assistance, colonoscopy as above  #3. Hypokalemia - probably from poor oral intake. Resolved  #4. History of Parkinson's disease - continue home medications.   #5 leukocytosis/Probable PNA  -CT scan of abdomen and pelvis with bilateral lower airspace disease suspicious for pulmonary edema or pneumonia  -continue empiric antibiotics and follow. -BNP elevated, echo with EF 55-60%, CXR with bilateral lobe infiltrates.  -continue current abx- recommend x 3 more days, leukocytosis resolved -pulm toilet  Code Status: FULL Family Communication: Wife at bedside Disposition Plan: home when medically ready with hh   Consultants:  GI consulted-eval pending  Procedures:  EGD 12/13-negative -Status post colonoscopy 12/14 revealed rectal mass -s/p rigid sigmoidoscopy at bedside 12/16 per surgery   Antibiotics:  levaquin started on 12/14  HPI/Subjective: Some abdominal discomfort , denies any new complaints.  Objective: Filed Vitals:   01/05/12 1600 01/05/12 1615 01/05/12 2008 01/06/12 0552  BP: 141/46 138/49 151/46 145/72  Pulse: 66 55 72 77  Temp:  98 F (36.7 C) 97.5 F (36.4 C) 98.3 F (36.8 C)  TempSrc:   Oral Oral  Resp: 12 12 14 18   Height:      Weight:      SpO2: 99% 98%  100% 97%    Intake/Output Summary (Last 24 hours) at 01/06/12 1359 Last data filed at 01/06/12 0900  Gross per 24 hour  Intake 1729.17 ml  Output    700 ml  Net 1029.17 ml   Filed Weights   12/30/11 2246  Weight: 62.8 kg (138 lb 7.2 oz)    Exam:   General:  Awake, pleasant, some abdominal discomfort, hard to comprehend, no distress  Cardiovascular: Regular rate and rhythm, normal S1-S2  Respiratory: Decreased breath sounds at bases, otherwise clear to auscultation  Abdomen: Soft, decreased bowel sounds present midline with dressing clean and dry.   Data Reviewed: Basic Metabolic Panel:  Lab 01/06/12 8657 01/04/12 0500 01/03/12 0610 12/31/11 0555 12/30/11 1943  NA 136 138 138 137 140  K 3.8 3.7 3.4* 4.3 2.9*  CL 103 106 106 105 105  CO2 21 23 24 25 26   GLUCOSE 102* 86 80 81 94  BUN 8 9 9 11 13   CREATININE 0.59 0.63 0.61 0.58 0.66  CALCIUM 8.1* 8.3* 8.2* 8.1* 8.4  MG -- -- -- -- --  PHOS -- -- -- -- --   Liver Function Tests:  Lab 12/31/11 0555 12/30/11 1943  AST 21 19  ALT 10 13  ALKPHOS 98 108  BILITOT 2.9* 0.7  PROT 5.3* 5.8*  ALBUMIN 2.6* 2.9*   No results found for this basename: LIPASE:5,AMYLASE:5 in the last 168 hours No results found for this basename: AMMONIA:5 in the last 168 hours CBC:  Lab 01/06/12 0600 01/04/12 0500 01/03/12 0610 01/02/12 0522 01/01/12 0430  WBC 8.2 5.2 6.3 13.9* 6.7  NEUTROABS -- -- -- -- --  HGB 9.6* 9.3*  9.2* 10.3* 6.6*  HCT 32.8* 31.8* 31.1* 33.9* 23.0*  MCV 76.8* 74.1* 72.3* 70.3* 65.9*  PLT 167 208 192 230 217   Cardiac Enzymes: No results found for this basename: CKTOTAL:5,CKMB:5,CKMBINDEX:5,TROPONINI:5 in the last 168 hours BNP (last 3 results)  Basename 01/02/12 0522  PROBNP 1043.0*   CBG: No results found for this basename: GLUCAP:5 in the last 168 hours  Recent Results (from the past 240 hour(s))  SURGICAL PCR SCREEN     Status: Normal   Collection Time   01/04/12  6:08 PM      Component Value Range  Status Comment   MRSA, PCR NEGATIVE  NEGATIVE Final    Staphylococcus aureus NEGATIVE  NEGATIVE Final      Studies: No results found.  Scheduled Meds:    . heparin subcutaneous  5,000 Units Subcutaneous Q8H  . levofloxacin (LEVAQUIN) IV  750 mg Intravenous Q24H  . pantoprazole (PROTONIX) IV  40 mg Intravenous Q24H  . white petrolatum       Continuous Infusions:    . dextrose 5 % and 0.9 % NaCl with KCl 20 mEq/L 100 mL/hr at 01/06/12 9604    Principal Problem:  *Anemia Active Problems:  GI bleed  Parkinson's disease  Hypokalemia  Leukocytosis  Rectal mass    Time spent: 27    [facility]  [facility] Pager [phone]. If 8PM-8AM, please contact night-coverage at [email or web address], password [facility] 01/06/2012, 1:59 PM  LOS: 7 days

## 2012-01-06 NOTE — Progress Notes (Signed)
Patient ID: Cole Cunningham, male   DOB: 01-05-1933, 78 y.o.   MRN: [id number] 1 Day Post-Op  Subjective: Some pain, denies nausea  Objective: Vital signs in last 24 hours: Temp:  [97.5 F (36.4 C)-98.3 F (36.8 C)] 98.3 F (36.8 C) (12/18 0552) Pulse Rate:  [55-99] 77  (12/18 0552) Resp:  [12-18] 18  (12/18 0552) BP: (138-182)/(46-92) 145/72 mmHg (12/18 0552) SpO2:  [95 %-100 %] 97 % (12/18 0552) FiO2 (%):  [2 %] 2 % (12/18 0552) Last BM Date: 01/02/12  Intake/Output from previous day: 12/17 0701 - 12/18 0700 In: 2879.2 [I.V.:2879.2] Out: 1000 [Urine:950; Blood:50] Intake/Output this shift:    General appearance: alert, cooperative and no distress Resp: clear to auscultation bilaterally GI: Mild appropriate tenderness, non distended Incision/Wound: Dressing clean and dry  Lab Results:   Basename 01/06/12 0600 01/04/12 0500  WBC 8.2 5.2  HGB 9.6* 9.3*  HCT 32.8* 31.8*  PLT 167 208   BMET  Basename 01/04/12 0500  NA 138  K 3.7  CL 106  CO2 23  GLUCOSE 86  BUN 9  CREATININE 0.63  CALCIUM 8.3*     Studies/Results: No results found.  Anti-infectives: Anti-infectives     Start     Dose/Rate Route Frequency Ordered Stop   01/05/12 1300   ertapenem (INVANZ) 1 g in sodium chloride 0.9 % 50 mL IVPB        1 g 100 mL/hr over 30 Minutes Intravenous To Surgery 01/05/12 1246 01/05/12 1253   01/02/12 2000   levofloxacin (LEVAQUIN) IVPB 750 mg        750 mg 100 mL/hr over 90 Minutes Intravenous Every 24 hours 01/02/12 1851            Assessment/Plan: s/p Procedure(s): LOW ANTERIOR BOWEL RESECTION Doing well today OOB Cont NPO   LOS: 7 days    Twyla Dais T 01/06/2012

## 2012-01-06 NOTE — Progress Notes (Signed)
Physical Therapy Treatment Patient Details Name: Cole Cunningham MRN: [id number] DOB: Nov 16, 1932 Today's Date: 01/06/2012 Time: 4098-1191 PT Time Calculation (min): 20 min  PT Assessment / Plan / Recommendation Comments on Treatment Session  Pt mobilizing well s/p lower anterior bowel rxn. Pt to con't to benefit from skilled PT to achieve maximal functional recovery. Pt remains to require assist for bed and OOBmobility. Pt con't to require 24/7 assist and use of RW upon d/c. Pt to benefit from HHPT to return to functional baseline.    Follow Up Recommendations  Home health PT;Supervision/Assistance - 24 hour     Does the patient have the potential to tolerate intense rehabilitation     Barriers to Discharge        Equipment Recommendations  None recommended by PT    Recommendations for Other Services    Frequency Min 3X/week   Plan Discharge plan remains appropriate;Frequency remains appropriate    Precautions / Restrictions Precautions Precautions: Fall Restrictions Weight Bearing Restrictions: No   Pertinent Vitals/Pain 8/10 at surgical site, RN provided pain medication    Mobility  Bed Mobility Bed Mobility: Rolling Left;Left Sidelying to Sit;Sitting - Scoot to Edge of Bed Rolling Left: 3: Mod assist Left Sidelying to Sit: 3: Mod assist Sitting - Scoot to Edge of Bed: 3: Mod assist Details for Bed Mobility Assistance: assist with trunk elevation and LE mangement due to abdominal pain from sx Transfers Transfers: Sit to Stand;Stand to Sit Sit to Stand: 4: Min assist;With upper extremity assist;From bed Stand to Sit: 4: Min assist;To chair/3-in-1 Details for Transfer Assistance: increased time, assist to initiate transfer Ambulation/Gait Ambulation/Gait Assistance: 4: Min assist Ambulation Distance (Feet): 30 Feet Assistive device: Rolling walker Ambulation/Gait Assistance Details: excessive trunk flexion, minA to keep walker close to body Gait Pattern:  Step-through pattern;Decreased step length - left;Decreased step length - right;Decreased stride length;Narrow base of support;Shuffle;Trunk flexed Gait velocity: Decreased Stairs: No    Exercises     PT Diagnosis:    PT Problem List:   PT Treatment Interventions:     PT Goals Acute Rehab PT Goals Time For Goal Achievement: 01/13/12 Potential to Achieve Goals: Good PT Goal: Supine/Side to Sit - Progress: Progressing toward goal PT Goal: Sit to Supine/Side - Progress: Progressing toward goal PT Goal: Sit to Stand - Progress: Progressing toward goal PT Goal: Stand to Sit - Progress: Progressing toward goal PT Goal: Ambulate - Progress: Progressing toward goal  Visit Information  Last PT Received On: 01/06/12 Assistance Needed: +1    Subjective Data  Subjective: Pt with h/o stroke, limited verbal communication. primarily shakes head yes/no. Pt now POD #1 lower bowel rxn.   Cognition  Overall Cognitive Status: Appears within functional limits for tasks assessed/performed Arousal/Alertness: Awake/alert Orientation Level: Appears intact for tasks assessed Behavior During Session: [facility] for tasks performed Cognition - Other Comments: pt emotionally labile, cries due to h/o stroke    Balance  Static Standing Balance Static Standing - Balance Support: Bilateral upper extremity supported Static Standing - Level of Assistance: 4: Min assist  End of Session PT - End of Session Equipment Utilized During Treatment: Gait belt Activity Tolerance: Patient tolerated treatment well Patient left: in chair;with call bell/phone within reach;with family/visitor present;with chair alarm set Nurse Communication: Mobility status   GP     Cole Cunningham 01/06/2012, 4:56 PM

## 2012-01-06 NOTE — Progress Notes (Signed)
Patient interviewed and examined, agree with NP note above.  Mariella Saa MD, FACS  01/06/2012 11:56 AM

## 2012-01-06 NOTE — Progress Notes (Signed)
Pt had dry mouth- did biotene rinse.

## 2012-01-07 ENCOUNTER — Encounter (HOSPITAL_COMMUNITY): Payer: Self-pay | Admitting: General Surgery

## 2012-01-07 DIAGNOSIS — C189 Malignant neoplasm of colon, unspecified: Secondary | ICD-10-CM

## 2012-01-07 LAB — IRON AND TIBC
Iron: 22 ug/dL — ABNORMAL LOW (ref 42–135)
TIBC: 245 ug/dL (ref 215–435)

## 2012-01-07 LAB — BASIC METABOLIC PANEL
CO2: 30 mEq/L (ref 19–32)
Calcium: 8.1 mg/dL — ABNORMAL LOW (ref 8.4–10.5)
GFR calc Af Amer: >90 mL/min (ref >90)
GFR calc non Af Amer: >90 mL/min (ref >90)
Sodium: 137 mEq/L (ref 135–145)

## 2012-01-07 LAB — CBC
MCH: 22.3 pg — ABNORMAL LOW (ref 26.0–34.0)
Platelets: 151 10*3/uL (ref 150–400)
RBC: 4.08 MIL/uL — ABNORMAL LOW (ref 4.22–5.81)
WBC: 5.9 10*3/uL (ref 4.0–10.5)

## 2012-01-07 LAB — FERRITIN: Ferritin: 58 ng/mL (ref 22–322)

## 2012-01-07 NOTE — Progress Notes (Signed)
PT Cancellation Note  Patient Details Name: Cole Cunningham MRN: [id number] DOB: 1932/03/22   Cancelled Treatment:    Reason Eval/Treat Not Completed: Fatigue/lethargy limiting ability to participate (pt sound asleep since given pain meds this am.)  Will try back tomorrow.     Sunny Schlein, [place] [phone] 01/07/2012, 10:55 AM

## 2012-01-07 NOTE — Progress Notes (Signed)
Patient ID: Cole Cunningham, male   DOB: [birth year], 78 y.o.   MRN: [id number] 2 Days Post-Op  Subjective: appears to be in NAD, + for abdominal pain from surgery. Wife at bedside.  Objective: Vital signs in last 24 hours: Temp:  [97.9 F (36.6 C)-99 F (37.2 C)] 97.9 F (36.6 C) (12/19 0531) Pulse Rate:  [71-91] 71  (12/19 0531) Resp:  [18-20] 20  (12/19 0531) BP: (134-154)/(71) 134/71 mmHg (12/19 0531) SpO2:  [99 %-100 %] 99 % (12/19 0531) FiO2 (%):  [2 %] 2 % (12/18 1429) Last BM Date: 01/02/12  Intake/Output from previous day: 12/18 0701 - 12/19 0700 In: 1885 [I.V.:1725; IV Piggyback:160] Out: 1100 [Urine:1100] Intake/Output this shift:    General appearance: alert, cooperative, appears stated age and mild distress Chest: CTA Cardiac: RRR Abdomen: tender over surgical incision, dressing is C/D/I, abdomen is soft, No BS. Extremities: warm to touch, no edema or tenderness, + pulses. Labs: WBC wnl,  H&H stable VSS, afebrile  Lab Results:   Basename 01/07/12 0430 01/06/12 0600  WBC 5.9 8.2  HGB 9.1* 9.6*  HCT 31.0* 32.8*  PLT 151 167   BMET  Basename 01/07/12 0430 01/06/12 0600  NA 137 136  K 3.7 3.8  CL 104 103  CO2 30 21  GLUCOSE 95 102*  BUN 7 8  CREATININE 0.60 0.59  CALCIUM 8.1* 8.1*   PT/INR No results found for this basename: LABPROT:2,INR:2 in the last 72 hours ABG No results found for this basename: PHART:2,PCO2:2,PO2:2,HCO3:2 in the last 72 hours  Studies/Results: No results found.  Anti-infectives: Anti-infectives     Start     Dose/Rate Route Frequency Ordered Stop   01/05/12 1300   ertapenem (INVANZ) 1 g in sodium chloride 0.9 % 50 mL IVPB        1 g 100 mL/hr over 30 Minutes Intravenous To Surgery 01/05/12 1246 01/05/12 1253   01/02/12 2000   levofloxacin (LEVAQUIN) IVPB 750 mg        750 mg 100 mL/hr over 90 Minutes Intravenous Every 24 hours 01/02/12 1851            Assessment/Plan: s/p Procedure(s) (LRB) with  comments: LOW ANTERIOR BOWEL RESECTION (N/A)  Patient Active Problem List  Diagnosis  . Anemia  . GI bleed  . Parkinson's disease  . Hypokalemia  . Leukocytosis  . Rectal mass  . Adenocarcinoma of colon  management per medicine for his other medical needs Continue NPO, pain management, IVF OOB Continue to follow clinical picture.   LOS: 8 days    Golda Acre [facility] Pager # [phone] 01/07/2012

## 2012-01-07 NOTE — Progress Notes (Signed)
NUTRITION FOLLOW UP  Intervention:   1. If pt to remain NPO or CL, recommend initiation of TPN to meet nutrition needs until pt able to tolerate PO diet.  2. If diet is able to advance, RD will add nutrition supplements to better meet needs.  3. Recommend daily weight monitoring 4. RD will continue to follow    Nutrition Dx: Inadequate oral intake related to diet provisions as evidenced by clear liquid or full liquid diet x 6 days   Goal:   PO intake to meet>/=90% estimated nutrition needs. Unmet    Monitor:   PO intake, diet advance, labs  Assessment:   S/p Low Anterior bowel resection on 12/17. Remains NPO from procedure. Pt has been NPO or CL diet x 8 days. If pt to remain NPO or CL, recommend initiation of TPN to meet nutrition needs until pt able to meet needs with PO intake.   Height: Ht Readings from Last 1 Encounters:  12/30/11 5\' 8"  (1.727 m)    Weight Status:   Wt Readings from Last 1 Encounters:  12/30/11 138 lb 7.2 oz (62.8 kg)  No new weights have been obtained this admission. Recommend daily weights to monitor for weight loss/malnutrition.   Re-estimated needs:  Kcal: 1600-1800 Protein: 65-75 gm  Fluid: 1.6-1.8 L    Skin: New stage I on coccyx   Diet Order: NPO   Intake/Output Summary (Last 24 hours) at 01/07/12 0746 Last data filed at 01/07/12 0600  Gross per 24 hour  Intake   1885 ml  Output   1100 ml  Net    785 ml  Net + 6.5L this admission   Last BM: 12/16   Labs:   Lab 01/07/12 0430 01/06/12 0600 01/04/12 0500  NA 137 136 138  K 3.7 3.8 3.7  CL 104 103 106  CO2 30 21 23   BUN 7 8 9   CREATININE 0.60 0.59 0.63  CALCIUM 8.1* 8.1* 8.3*  MG -- -- --  PHOS -- -- --  GLUCOSE 95 102* 86   CBG (last 3)  No results found for this basename: GLUCAP:3 in the last 72 hours  Scheduled Meds:    . heparin subcutaneous  5,000 Units Subcutaneous Q8H  . levofloxacin (LEVAQUIN) IV  750 mg Intravenous Q24H  . pantoprazole (PROTONIX) IV  40 mg  Intravenous Q24H   Continuous Infusions:    . dextrose 5 % and 0.9 % NaCl with KCl 20 mEq/L 75 mL/hr at 01/06/12 1652       Clarene Duke RD, LDN Pager [phone] After Hours pager [phone]

## 2012-01-07 NOTE — Progress Notes (Signed)
01/07/12 1000  Clinical Encounter Type  Visited With Family  Visit Type Follow-up   follow up visit to check on the patient after his surgery. Patient was sleeping. Spoke to his wife and she said her husband is doing okay since the procedure.   Veryl Speak

## 2012-01-07 NOTE — Progress Notes (Signed)
[facility] PROGRESS NOTE  Cole Cunningham WUJ:[id number] DOB: [birth year] DOA: 12/30/2011 PCP: Claude Manges, FNP  Assessment/Plan: #1.Adenocarcinoma, rectosigmoid, Rectal mass-nearly obstructing -CT scan of abdomen and pelvis with no definite evidence of metastatic disease, CEA 1.7(wnl) -s/p Low anterior bowel resection 12/17 -surgery following, NPO for now, hopefully start liquid diet tomorrow otherwise will need TNA in interim  #2. Symptomatic severe anemia secondary to GI bleed -secondary to #1, status post transfusion of 2 units of packed red blood cells on admission, but hemoglobin was 6.6 12/13- was transfused another 2 units of packed red blood cells and hgb improved to 10.3 12/14, hgb 9.3 12/16, remaining stable. -EGD negative, appreciate GI assistance, colonoscopy as above  #3. Hypokalemia - probably from poor oral intake. Resolved  #4. History of Parkinson's disease - continue home medications.   #5 leukocytosis/Probable PNA  -CT scan of abdomen and pelvis with bilateral lower airspace disease suspicious for pulmonary edema or pneumonia  -continue empiric antibiotics and follow. -BNP elevated, echo with EF 55-60%, CXR with bilateral lobe infiltrates.  -continue current abx- recommend x 2 more days, leukocytosis resolved -pulm toilet  Code Status: FULL Family Communication: Wife at bedside Disposition Plan: home when medically ready with hh services   Consultants:  GI consulted-eval pending  Procedures:  EGD 12/13-negative -Status post colonoscopy 12/14 revealed rectal mass -s/p rigid sigmoidoscopy at bedside 12/16 per surgery   Antibiotics:  levaquin started on 12/14  HPI/Subjective: Some abdominal discomfort , denies any new complaints.  Objective: Filed Vitals:   01/06/12 0552 01/06/12 1429 01/06/12 2123 01/07/12 0531  BP: 145/72 150/71 154/71 134/71  Pulse: 77 91 82 71  Temp: 98.3 F (36.8 C) 98.3 F (36.8 C) 99 F (37.2 C) 97.9 F  (36.6 C)  TempSrc: Oral Oral Oral Oral  Resp: 18 20 18 20   Height:      Weight:      SpO2: 97% 99% 100% 99%    Intake/Output Summary (Last 24 hours) at 01/07/12 1347 Last data filed at 01/07/12 1200  Gross per 24 hour  Intake   1885 ml  Output   1000 ml  Net    885 ml   Filed Weights   12/30/11 2246  Weight: 62.8 kg (138 lb 7.2 oz)    Exam:   General:  Awake, pleasant, some abdominal discomfort, hard to comprehend, no distress  Cardiovascular: Regular rate and rhythm, normal S1-S2  Respiratory: Decreased breath sounds at bases, otherwise clear to auscultation  Abdomen: Soft, decreased bowel sounds present midline with dressing clean and dry.   Data Reviewed: Basic Metabolic Panel:  Lab 01/07/12 9562 01/06/12 0600 01/04/12 0500 01/03/12 0610  NA 137 136 138 138  K 3.7 3.8 3.7 3.4*  CL 104 103 106 106  CO2 30 21 23 24   GLUCOSE 95 102* 86 80  BUN 7 8 9 9   CREATININE 0.60 0.59 0.63 0.61  CALCIUM 8.1* 8.1* 8.3* 8.2*  MG -- -- -- --  PHOS -- -- -- --   Liver Function Tests: No results found for this basename: AST:5,ALT:5,ALKPHOS:5,BILITOT:5,PROT:5,ALBUMIN:5 in the last 168 hours No results found for this basename: LIPASE:5,AMYLASE:5 in the last 168 hours No results found for this basename: AMMONIA:5 in the last 168 hours CBC:  Lab 01/07/12 0430 01/06/12 0600 01/04/12 0500 01/03/12 0610 01/02/12 0522  WBC 5.9 8.2 5.2 6.3 13.9*  NEUTROABS -- -- -- -- --  HGB 9.1* 9.6* 9.3* 9.2* 10.3*  HCT 31.0* 32.8* 31.8* 31.1* 33.9*  MCV 76.0* 76.8*  74.1* 72.3* 70.3*  PLT 151 167 208 192 230   Cardiac Enzymes: No results found for this basename: CKTOTAL:5,CKMB:5,CKMBINDEX:5,TROPONINI:5 in the last 168 hours BNP (last 3 results)  Basename 01/02/12 0522  PROBNP 1043.0*   CBG: No results found for this basename: GLUCAP:5 in the last 168 hours  Recent Results (from the past 240 hour(s))  SURGICAL PCR SCREEN     Status: Normal   Collection Time   01/04/12  6:08 PM       Component Value Range Status Comment   MRSA, PCR NEGATIVE  NEGATIVE Final    Staphylococcus aureus NEGATIVE  NEGATIVE Final      Studies: No results found.  Scheduled Meds:    . heparin subcutaneous  5,000 Units Subcutaneous Q8H  . levofloxacin (LEVAQUIN) IV  750 mg Intravenous Q24H  . pantoprazole (PROTONIX) IV  40 mg Intravenous Q24H   Continuous Infusions:    . dextrose 5 % and 0.9 % NaCl with KCl 20 mEq/L 75 mL/hr at 01/07/12 1155    Principal Problem:  *Anemia Active Problems:  GI bleed  Parkinson's disease  Hypokalemia  Leukocytosis  Rectal mass  Adenocarcinoma of colon    Time spent: 30    [facility]  [facility] Pager [phone]. If 8PM-8AM, please contact night-coverage at [email or web address], password [facility] 01/07/2012, 1:47 PM  LOS: 8 days

## 2012-01-07 NOTE — Progress Notes (Signed)
Patient interviewed and examined, agree with NP note above. Doing well postoperatively Mariella Saa MD, FACS  01/07/2012 12:03 PM

## 2012-01-08 NOTE — Progress Notes (Signed)
Occupational Therapy Treatment Patient Details Name: Cole Cunningham MRN: [id number] DOB: December 06, 1932 Today's Date: 01/08/2012 Time: 4098-1191 OT Time Calculation (min): 10 min  OT Assessment / Plan / Recommendation Comments on Treatment Session Pt limited by pain this session.    Follow Up Recommendations       Barriers to Discharge       Equipment Recommendations  None recommended by OT    Recommendations for Other Services    Frequency Min 2X/week   Plan Discharge plan remains appropriate    Precautions / Restrictions Precautions Precautions: Fall   Pertinent Vitals/Pain See vitals    ADL  Toilet Transfer: Performed;Moderate assistance Toilet Transfer Method: Sit to stand Toilet Transfer Equipment: Bedside commode Equipment Used: Rolling walker;Gait belt Transfers/Ambulation Related to ADLs: Pt performed toilet transfer.  Mod assist for sit<>stand and min assist with RW for ambulation. ADL Comments: Focus of session on practicing toilet transfer.  Pt limited by pain.    OT Diagnosis:    OT Problem List:   OT Treatment Interventions:     OT Goals ADL Goals Pt Will Transfer to Toilet: with supervision;Ambulation;with DME ADL Goal: Toilet Transfer - Progress: Progressing toward goals  Visit Information  Last OT Received On: 01/08/12 Assistance Needed: +1    Subjective Data      Prior Functioning       Cognition  Overall Cognitive Status: History of cognitive impairments - at baseline Arousal/Alertness: Awake/alert Orientation Level: Appears intact for tasks assessed Behavior During Session: [facility] for tasks performed    Mobility  Shoulder Instructions Bed Mobility Bed Mobility: Not assessed Supine to Sit: 3: Mod assist Sitting - Scoot to Edge of Bed: 3: Mod assist Details for Bed Mobility Assistance: Assist to bring trunk up Transfers Transfers: Sit to Stand;Stand to Sit Sit to Stand: 3: Mod assist;From chair/3-in-1;With upper extremity  assist;With armrests Stand to Sit: 4: Min assist;With armrests;With upper extremity assist;To chair/3-in-1 Details for Transfer Assistance: increased time to perform transfer.       Exercises      Balance Static Standing Balance Static Standing - Balance Support: Bilateral upper extremity supported (on walker) Static Standing - Level of Assistance: 4: Min assist   End of Session OT - End of Session Equipment Utilized During Treatment: Gait belt Activity Tolerance: Patient limited by pain Patient left: in chair;with call bell/phone within reach Nurse Communication: Mobility status  GO   01/08/2012 Cipriano Mile OTR/L Pager [phone] Office [phone]   Cipriano Mile 01/08/2012, 2:31 PM

## 2012-01-08 NOTE — Progress Notes (Signed)
Physical Therapy Treatment Patient Details Name: Cole Cunningham MRN: [id number] DOB: 10-21-1932 Today's Date: 01/08/2012 Time: 9629-5284 PT Time Calculation (min): 26 min  PT Assessment / Plan / Recommendation Comments on Treatment Session  Pt s/p bowel resection.  Pt making good progress with mobility.    Follow Up Recommendations  Home health PT;Supervision/Assistance - 24 hour     Does the patient have the potential to tolerate intense rehabilitation     Barriers to Discharge        Equipment Recommendations  None recommended by PT    Recommendations for Other Services    Frequency Min 3X/week   Plan Discharge plan remains appropriate;Frequency remains appropriate    Precautions / Restrictions Precautions Precautions: Fall Restrictions Weight Bearing Restrictions: No   Pertinent Vitals/Pain SaO2 98% on RA with amb.  Pt shook his head when asked about pain.  No grimacing noted during treatment.    Mobility  Bed Mobility Supine to Sit: 3: Mod assist Sitting - Scoot to Edge of Bed: 3: Mod assist Details for Bed Mobility Assistance: Assist to bring trunk up Transfers Sit to Stand: 4: Min assist;With upper extremity assist;From bed;From chair/3-in-1;With armrests Stand to Sit: 4: Min assist;Without upper extremity assist;To chair/3-in-1 Details for Transfer Assistance: verbal/tactile cues for hand placement. Ambulation/Gait Ambulation Distance (Feet): 200 Feet (100' x 2) Assistive device: Rolling walker Ambulation/Gait Assistance Details: verbal cues to stay closer to walker and stand more erect. Gait Pattern: Step-through pattern;Decreased step length - right;Decreased step length - left;Shuffle;Trunk flexed Gait velocity: decr    Exercises     PT Diagnosis:    PT Problem List:   PT Treatment Interventions:     PT Goals Acute Rehab PT Goals PT Goal: Supine/Side to Sit - Progress: Progressing toward goal PT Goal: Sit to Stand - Progress: Progressing  toward goal PT Goal: Stand to Sit - Progress: Progressing toward goal PT Goal: Ambulate - Progress: Progressing toward goal  Visit Information  Last PT Received On: 01/08/12 Assistance Needed: +1    Subjective Data  Subjective: Pt nods/shakes head yes/no.   Cognition  Overall Cognitive Status: History of cognitive impairments - at baseline Arousal/Alertness: Awake/alert Behavior During Session: [facility] for tasks performed    Balance  Static Standing Balance Static Standing - Balance Support: Bilateral upper extremity supported (on walker) Static Standing - Level of Assistance: 4: Min assist  End of Session PT - End of Session Equipment Utilized During Treatment: Gait belt Activity Tolerance: Patient tolerated treatment well Patient left: in chair;with call bell/phone within reach;with family/visitor present (wife stated she would be with the pt) Nurse Communication: Mobility status   GP     [facility] 01/08/2012, 11:26 AM  Skip Mayer PT [phone]

## 2012-01-08 NOTE — Progress Notes (Signed)
Patient ID: Cole Cunningham, male   DOB: [birth year], 78 y.o.   MRN: [id number] 3 Days Post-Op  Subjective: appears to be in NAD, less abdominal pain this morning. No N/V/D Dressing C/D/I  Objective: Vital signs in last 24 hours: Temp:  [97.1 F (36.2 C)-98.1 F (36.7 C)] 97.8 F (36.6 C) (12/20 0513) Pulse Rate:  [62-79] 62  (12/20 0513) Resp:  [12-22] 12  (12/20 0513) BP: (128-136)/(66-72) 136/72 mmHg (12/20 0513) SpO2:  [100 %] 100 % (12/20 0513) Last BM Date: 01/04/12  Intake/Output from previous day: 12/19 0701 - 12/20 0700 In: 1800 [I.V.:1800] Out: 300 [Urine:300] Intake/Output this shift:    General appearance: alert, cooperative, appears stated age and mild distress Chest: CTA Cardiac: RRR Abdomen: tender over surgical incision, dressing is C/D/I, abdomen is soft, + BS. Extremities: warm to touch, no edema or tenderness, + pulses. Labs: WBC wnl,  H&H stable VSS, afebrile  Lab Results:   Basename 01/07/12 0430 01/06/12 0600  WBC 5.9 8.2  HGB 9.1* 9.6*  HCT 31.0* 32.8*  PLT 151 167   BMET  Basename 01/07/12 0430 01/06/12 0600  NA 137 136  K 3.7 3.8  CL 104 103  CO2 30 21  GLUCOSE 95 102*  BUN 7 8  CREATININE 0.60 0.59  CALCIUM 8.1* 8.1*   PT/INR No results found for this basename: LABPROT:2,INR:2 in the last 72 hours ABG No results found for this basename: PHART:2,PCO2:2,PO2:2,HCO3:2 in the last 72 hours  Studies/Results: No results found.  Anti-infectives: Anti-infectives     Start     Dose/Rate Route Frequency Ordered Stop   01/05/12 1300   ertapenem (INVANZ) 1 g in sodium chloride 0.9 % 50 mL IVPB        1 g 100 mL/hr over 30 Minutes Intravenous To Surgery 01/05/12 1246 01/05/12 1253   01/02/12 2000   levofloxacin (LEVAQUIN) IVPB 750 mg        750 mg 100 mL/hr over 90 Minutes Intravenous Every 24 hours 01/02/12 1851            Assessment/Plan: s/p Procedure(s) (LRB) with comments: LOW ANTERIOR BOWEL RESECTION (N/A)  Patient  Active Problem List  Diagnosis  . Anemia  . GI bleed  . Parkinson's disease  . Hypokalemia  . Leukocytosis  . Rectal mass  . Adenocarcinoma of colon  management per medicine for his other medical needs Will advance to sips of clears today; if tolerated will adv diet as we are able over next cpl of days. Continue to follow clinical picture.   LOS: 9 days    Golda Acre [facility] Pager # [phone] 01/08/2012

## 2012-01-08 NOTE — Progress Notes (Signed)
Patient interviewed and examined, agree with NP note above. Continues to do very well Mariella Saa MD, FACS  01/08/2012 3:02 PM

## 2012-01-08 NOTE — Progress Notes (Signed)
[facility] PROGRESS NOTE  Cole Cunningham ZOX:[id number] DOB: 01-Feb-1932 DOA: 12/30/2011 PCP: Claude Manges, FNP  Assessment/Plan: #1.Adenocarcinoma, rectosigmoid, Rectal mass-nearly obstructing -CT scan of abdomen and pelvis with no definite evidence of metastatic disease, CEA 1.7(wnl) -s/p Low anterior bowel resection 12/17 -surgery following, started clears advance per [facility] Path still pending  #2. Symptomatic severe anemia secondary to GI bleed -secondary to #1, status post transfusion of 2 units of PRBC on admission, then 12/13- was transfused another 2 units, Hb stable since -EGD negative, appreciate GI assistance, colonoscopy as above  #3. Hypokalemia - probably from poor oral intake. Resolved  #4. History of Parkinson's disease - continue home medications.   #5 leukocytosis/Probable PNA  -CT scan of abdomen and pelvis with bilateral lower airspace disease suspicious for pulmonary edema or pneumonia  -continue empiric antibiotics and follow. --continue current abx- Levaquin for 1 more day, leukocytosis resolved -pulm toilet  Code Status: FULL Family Communication: Wife at bedside Disposition Plan: home when medically ready with hh services   Consultants:  GI consulted-eval pending  Procedures:  EGD 12/13-negative -Status post colonoscopy 12/14 revealed rectal mass -s/p rigid sigmoidoscopy at bedside 12/16 per surgery   Antibiotics:  levaquin started on 12/14  HPI/Subjective: Some abdominal discomfort , denies any new complaints.  Objective: Filed Vitals:   01/08/12 0513 01/08/12 1040 01/08/12 1100 01/08/12 1409  BP: 136/72   128/38  Pulse: 62   90  Temp: 97.8 F (36.6 C)   97.1 F (36.2 C)  TempSrc: Oral   Oral  Resp: 12   18  Height:      Weight:      SpO2: 100% 100% 98% 95%    Intake/Output Summary (Last 24 hours) at 01/08/12 1502 Last data filed at 01/08/12 0600  Gross per 24 hour  Intake   1800 ml  Output      0 ml  Net   1800  ml   Filed Weights   12/30/11 2246  Weight: 62.8 kg (138 lb 7.2 oz)    Exam:   General:  Awake, pleasant, hard to comprehend, no distress  Cardiovascular: Regular rate and rhythm, normal S1-S2  Respiratory: Decreased breath sounds at bases, otherwise clear to auscultation  Abdomen: Soft, decreased bowel sounds present midline with dressing clean and dry.   Ext: no edema c/c  Data Reviewed: Basic Metabolic Panel:  Lab 01/07/12 8119 01/06/12 0600 01/04/12 0500 01/03/12 0610  NA 137 136 138 138  K 3.7 3.8 3.7 3.4*  CL 104 103 106 106  CO2 30 21 23 24   GLUCOSE 95 102* 86 80  BUN 7 8 9 9   CREATININE 0.60 0.59 0.63 0.61  CALCIUM 8.1* 8.1* 8.3* 8.2*  MG -- -- -- --  PHOS -- -- -- --   Liver Function Tests: No results found for this basename: AST:5,ALT:5,ALKPHOS:5,BILITOT:5,PROT:5,ALBUMIN:5 in the last 168 hours No results found for this basename: LIPASE:5,AMYLASE:5 in the last 168 hours No results found for this basename: AMMONIA:5 in the last 168 hours CBC:  Lab 01/07/12 0430 01/06/12 0600 01/04/12 0500 01/03/12 0610 01/02/12 0522  WBC 5.9 8.2 5.2 6.3 13.9*  NEUTROABS -- -- -- -- --  HGB 9.1* 9.6* 9.3* 9.2* 10.3*  HCT 31.0* 32.8* 31.8* 31.1* 33.9*  MCV 76.0* 76.8* 74.1* 72.3* 70.3*  PLT 151 167 208 192 230   Cardiac Enzymes: No results found for this basename: CKTOTAL:5,CKMB:5,CKMBINDEX:5,TROPONINI:5 in the last 168 hours BNP (last 3 results)  Basename 01/02/12 0522  PROBNP 1043.0*  CBG: No results found for this basename: GLUCAP:5 in the last 168 hours  Recent Results (from the past 240 hour(s))  SURGICAL PCR SCREEN     Status: Normal   Collection Time   01/04/12  6:08 PM      Component Value Range Status Comment   MRSA, PCR NEGATIVE  NEGATIVE Final    Staphylococcus aureus NEGATIVE  NEGATIVE Final      Studies: No results found.  Scheduled Meds:    . heparin subcutaneous  5,000 Units Subcutaneous Q8H  . levofloxacin (LEVAQUIN) IV  750 mg  Intravenous Q24H  . pantoprazole (PROTONIX) IV  40 mg Intravenous Q24H   Continuous Infusions:    . dextrose 5 % and 0.9 % NaCl with KCl 20 mEq/L 50 mL/hr at 01/08/12 1123    Principal Problem:  *Anemia Active Problems:  GI bleed  Parkinson's disease  Hypokalemia  Leukocytosis  Rectal mass  Adenocarcinoma of colon    Time spent: 30    [facility]  [facility] Pager [phone]. If 8PM-8AM, please contact night-coverage at [email or web address], password [facility] 01/08/2012, 3:02 PM  LOS: 9 days

## 2012-01-09 LAB — GLUCOSE, CAPILLARY: Glucose-Capillary: 105 mg/dL — ABNORMAL HIGH (ref 70–99)

## 2012-01-09 MED ORDER — PANTOPRAZOLE SODIUM 40 MG PO TBEC
40.0000 mg | DELAYED_RELEASE_TABLET | Freq: Every day | ORAL | Status: DC
  Administered 2012-01-09 – 2012-01-12 (×4): 40 mg via ORAL
  Filled 2012-01-09 (×3): qty 1

## 2012-01-09 NOTE — Progress Notes (Signed)
Patient ID: Cole Cunningham, male   DOB: Jul 24, 1932, 78 y.o.   MRN: [id number] 4 Days Post-Op  Subjective: Alert, denies pain or nausea but not drinking much. No BM or flatus yet  Objective: Vital signs in last 24 hours: Temp:  [97.1 F (36.2 C)-98.3 F (36.8 C)] 97.3 F (36.3 C) (12/21 0519) Pulse Rate:  [71-90] 71  (12/21 0519) Resp:  [18-20] 18  (12/21 0519) BP: (128-145)/(38-63) 135/57 mmHg (12/21 0519) SpO2:  [92 %-100 %] 92 % (12/21 0519) Last BM Date: 01/04/12  Intake/Output from previous day: 12/20 0701 - 12/21 0700 In: 120 [P.O.:120] Out: 250 [Urine:250] Intake/Output this shift:    General appearance: alert, cooperative and no distress GI: soft, non-tender; bowel sounds normal; no masses,  no organomegaly Incision/Wound: Slight bloody drainage form mid incision without erythema  Lab Results:   [facility] 01/07/12 0430  WBC 5.9  HGB 9.1*  HCT 31.0*  PLT 151   BMET  Basename 01/07/12 0430  NA 137  K 3.7  CL 104  CO2 30  GLUCOSE 95  BUN 7  CREATININE 0.60  CALCIUM 8.1*     Studies/Results: Path: Colon, segmental resection for tumor, Rectosigmoid - INVASIVE MODERATELY DIFFERENTIATED ADENOCARCINOMA WITH ASSOCIATED CALCIFICATION, SPANNING 3.5 CM IN GREATEST DIMENSION. - TUMOR INVADES THROUGH MUSCULARIS PROPRIA INTO SUBSEROSAL TISSUES. - MARGINS ARE NEGATIVE. - SINGLE SOFT TISSUE TUMOR DEPOSIT IS PRESENT, MEASURING 0.4 CM IN GREATEST DIMENSION. - FIFTEEN BENIGN LYMPH NODES WITH NO TUMOR SEEN (0/15).   Anti-infectives: Anti-infectives     Start     Dose/Rate Route Frequency Ordered Stop   01/05/12 1300   ertapenem (INVANZ) 1 g in sodium chloride 0.9 % 50 mL IVPB        1 g 100 mL/hr over 30 Minutes Intravenous To Surgery 01/05/12 1246 01/05/12 1253   01/02/12 2000   levofloxacin (LEVAQUIN) IVPB 750 mg        750 mg 100 mL/hr over 90 Minutes Intravenous Every 24 hours 01/02/12 1851            Assessment/Plan: s/p Procedure(s): LOW  ANTERIOR BOWEL RESECTION Doing well post op without apparent complication Try [place] diet   LOS: 10 days    Dyshaun Bonzo T 01/09/2012

## 2012-01-09 NOTE — Progress Notes (Signed)
[facility] PROGRESS NOTE  Cole Cunningham WUJ:[id number] DOB: Feb 22, 1932 DOA: 12/30/2011 PCP: Claude Manges, FNP  Assessment/Plan: #1.Adenocarcinoma, rectosigmoid, Rectal mass-nearly obstructing -CT scan of abdomen and pelvis with no definite evidence of metastatic disease, CEA 1.7(wnl) -s/p Low anterior bowel resection 12/17 -surgery following, started clears advance per [facility] Path with invasive adenoCA, margins negative, LNs negative  #2. Symptomatic severe anemia secondary to GI bleed -secondary to #1, status post transfusion of 2 units of PRBC on admission, then 12/13- was transfused another 2 units, Hb stable since -EGD negative, appreciate GI assistance, colonoscopy as above  #3. Hypokalemia - probably from poor oral intake. Resolved  #4. History of Parkinson's disease - continue home medications.   #5 leukocytosis/Probable PNA  -CT scan of abdomen and pelvis with bilateral lower airspace disease suspicious for pulmonary edema or pneumonia  -continue empiric antibiotics and follow. --continue current abx- stop Levaquin, leukocytosis resolved -pulm toilet  Code Status: FULL Family Communication: Wife at bedside Disposition Plan: home when medically ready with hh services   Consultants:  GI consulted-eval pending  Procedures:  EGD 12/13-negative -Status post colonoscopy 12/14 revealed rectal mass -s/p rigid sigmoidoscopy at bedside 12/16 per surgery   Antibiotics:  levaquin started on 12/14  HPI/Subjective: Some abdominal discomfort , denies any new complaints.  Objective: Filed Vitals:   01/08/12 1100 01/08/12 1409 01/08/12 2108 01/09/12 0519  BP:  128/38 145/63 135/57  Pulse:  90 74 71  Temp:  97.1 F (36.2 C) 98.3 F (36.8 C) 97.3 F (36.3 C)  TempSrc:  Oral Oral Oral  Resp:  18 20 18   Height:      Weight:      SpO2: 98% 95% 93% 92%    Intake/Output Summary (Last 24 hours) at 01/09/12 1414 Last data filed at 01/09/12 1037  Gross per  24 hour  Intake    240 ml  Output    250 ml  Net    -10 ml   Filed Weights   12/30/11 2246  Weight: 62.8 kg (138 lb 7.2 oz)    Exam:   General:  Awake, pleasant, hard to comprehend, no distress  Cardiovascular: Regular rate and rhythm, normal S1-S2  Respiratory: Decreased breath sounds at bases, otherwise clear to auscultation  Abdomen: Soft, distended, decreased bowel sounds present midline with dressing clean and dry.   Ext: no edema c/c  Data Reviewed: Basic Metabolic Panel:  Lab 01/07/12 9562 01/06/12 0600 01/04/12 0500 01/03/12 0610  NA 137 136 138 138  K 3.7 3.8 3.7 3.4*  CL 104 103 106 106  CO2 30 21 23 24   GLUCOSE 95 102* 86 80  BUN 7 8 9 9   CREATININE 0.60 0.59 0.63 0.61  CALCIUM 8.1* 8.1* 8.3* 8.2*  MG -- -- -- --  PHOS -- -- -- --   Liver Function Tests: No results found for this basename: AST:5,ALT:5,ALKPHOS:5,BILITOT:5,PROT:5,ALBUMIN:5 in the last 168 hours No results found for this basename: LIPASE:5,AMYLASE:5 in the last 168 hours No results found for this basename: AMMONIA:5 in the last 168 hours CBC:  Lab 01/07/12 0430 01/06/12 0600 01/04/12 0500 01/03/12 0610  WBC 5.9 8.2 5.2 6.3  NEUTROABS -- -- -- --  HGB 9.1* 9.6* 9.3* 9.2*  HCT 31.0* 32.8* 31.8* 31.1*  MCV 76.0* 76.8* 74.1* 72.3*  PLT 151 167 208 192   Cardiac Enzymes: No results found for this basename: CKTOTAL:5,CKMB:5,CKMBINDEX:5,TROPONINI:5 in the last 168 hours BNP (last 3 results)  Basename 01/02/12 0522  PROBNP 1043.0*   CBG: No  results found for this basename: GLUCAP:5 in the last 168 hours  Recent Results (from the past 240 hour(s))  SURGICAL PCR SCREEN     Status: Normal   Collection Time   01/04/12  6:08 PM      Component Value Range Status Comment   MRSA, PCR NEGATIVE  NEGATIVE Final    Staphylococcus aureus NEGATIVE  NEGATIVE Final      Studies: No results found.  Scheduled Meds:    . heparin subcutaneous  5,000 Units Subcutaneous Q8H  . pantoprazole  40  mg Oral Q1200   Continuous Infusions:    . dextrose 5 % and 0.9 % NaCl with KCl 20 mEq/L 50 mL/hr at 01/08/12 2246    Principal Problem:  *Anemia Active Problems:  GI bleed  Parkinson's disease  Hypokalemia  Leukocytosis  Rectal mass  Adenocarcinoma of colon    Time spent: 16    [facility]  [facility] Pager [phone]. If 8PM-8AM, please contact night-coverage at [email or web address], password [facility] 01/09/2012, 2:14 PM  LOS: 10 days

## 2012-01-09 NOTE — Progress Notes (Signed)
Physical Therapy Treatment Patient Details Name: Cole Cunningham MRN: [id number] DOB: 23-Apr-1932 Today's Date: 01/09/2012 Time: 0102-7253 PT Time Calculation (min): 17 min  PT Assessment / Plan / Recommendation Comments on Treatment Session  Pt s/p bowel resection and with Parkinson's.  Pt making good progress with mobility.    Follow Up Recommendations  Home health PT;Supervision/Assistance - 24 hour     Does the patient have the potential to tolerate intense rehabilitation     Barriers to Discharge        Equipment Recommendations  None recommended by PT    Recommendations for Other Services    Frequency Min 3X/week   Plan Discharge plan remains appropriate;Frequency remains appropriate    Precautions / Restrictions Precautions Precautions: Fall   Pertinent Vitals/Pain No signs of pain.    Mobility  Bed Mobility Supine to Sit: 5: Supervision;HOB elevated Sitting - Scoot to Edge of Bed: 4: Min assist Details for Bed Mobility Assistance: Pt with incr time and assist to get rt hip to EOB. Transfers Sit to Stand: 4: Min assist;With upper extremity assist;From bed Stand to Sit: 4: Min guard;With upper extremity assist;With armrests;To chair/3-in-1 Ambulation/Gait Ambulation/Gait Assistance: 4: Min guard Ambulation Distance (Feet): 150 Feet Assistive device: Rolling walker Ambulation/Gait Assistance Details: verbal cues to stand more erect and stay closer to the walker. Gait Pattern: Step-through pattern;Decreased stride length;Trunk flexed;Shuffle Gait velocity: decr    Exercises     PT Diagnosis:    PT Problem List:   PT Treatment Interventions:     PT Goals Acute Rehab PT Goals PT Goal: Supine/Side to Sit - Progress: Met PT Goal: Sit to Stand - Progress: Progressing toward goal PT Goal: Stand to Sit - Progress: Progressing toward goal PT Goal: Ambulate - Progress: Progressing toward goal  Visit Information  Last PT Received On: 01/09/12 Assistance  Needed: +1    Subjective Data  Subjective: "She went to the gift shop," pt stated about his wife.   Cognition  Overall Cognitive Status: History of cognitive impairments - at baseline Arousal/Alertness: Awake/alert Behavior During Session: [name] for tasks performed    Balance  Static Standing Balance Static Standing - Balance Support: Bilateral upper extremity supported (on walker) Static Standing - Level of Assistance: 5: Stand by assistance  End of Session PT - End of Session Equipment Utilized During Treatment: Gait belt Activity Tolerance: Patient tolerated treatment well Patient left: in chair;with call bell/phone within reach;with chair alarm set;with family/visitor present Nurse Communication: Mobility status   GP     Cole Cunningham 01/09/2012, 4:23 PM  [employer] PT [phone]

## 2012-01-10 LAB — TYPE AND SCREEN
ABO/RH(D): A POS
Antibody Screen: NEGATIVE

## 2012-01-10 LAB — CBC
HCT: 29.9 % — ABNORMAL LOW (ref 39.0–52.0)
Hemoglobin: 9.1 g/dL — ABNORMAL LOW (ref 13.0–17.0)
Hemoglobin: 9.3 g/dL — ABNORMAL LOW (ref 13.0–17.0)
MCH: 23.1 pg — ABNORMAL LOW (ref 26.0–34.0)
MCHC: 30.4 g/dL (ref 30.0–36.0)
MCV: 75.9 fL — ABNORMAL LOW (ref 78.0–100.0)
Platelets: 142 10*3/uL — ABNORMAL LOW (ref 150–400)
RBC: 3.94 MIL/uL — ABNORMAL LOW (ref 4.22–5.81)
RBC: 4.08 MIL/uL — ABNORMAL LOW (ref 4.22–5.81)
WBC: 3.9 10*3/uL — ABNORMAL LOW (ref 4.0–10.5)

## 2012-01-10 MED ORDER — SODIUM CHLORIDE 0.9 % IJ SOLN
3.0000 mL | Freq: Two times a day (BID) | INTRAMUSCULAR | Status: DC
  Administered 2012-01-10 – 2012-01-12 (×5): 3 mL via INTRAVENOUS

## 2012-01-10 MED ORDER — MORPHINE SULFATE 2 MG/ML IJ SOLN
1.0000 mg | INTRAMUSCULAR | Status: DC | PRN
  Administered 2012-01-11 (×2): 2 mg via INTRAVENOUS
  Filled 2012-01-10 (×2): qty 1

## 2012-01-10 MED ORDER — OXYCODONE-ACETAMINOPHEN 5-325 MG PO TABS
1.0000 | ORAL_TABLET | Freq: Four times a day (QID) | ORAL | Status: DC | PRN
  Administered 2012-01-10 – 2012-01-11 (×2): 1 via ORAL
  Filled 2012-01-10 (×2): qty 1

## 2012-01-10 NOTE — Progress Notes (Signed)
Pt has liquid bloody bowel moment. MD notified, heparin d/c. Surgery notified. Will monitor pt.

## 2012-01-10 NOTE — Progress Notes (Signed)
Noted a skin tear to rt hand upon removal of IV site and surgical pink tape, alleyvn applied and skin care orders initiated. Jean Rosenthal, Burna [facility]

## 2012-01-10 NOTE — Progress Notes (Signed)
[facility] PROGRESS NOTE  Cole Cunningham ZOX:[id number] DOB: 09/23/1932 DOA: 12/30/2011 PCP: Claude Manges, FNP  Assessment/Plan: #1.Adenocarcinoma, rectosigmoid, Rectal mass-nearly obstructing -CT scan of abdomen and pelvis with no definite evidence of metastatic disease, CEA 1.7(wnl) -s/p Low anterior bowel resection 12/17 -surgery following, diet being advanced per [facility] Path with invasive adenoCA, margins negative, LNs negative  #2. Symptomatic severe anemia secondary to GI bleed -secondary to #1, status post transfusion of 2 units of PRBC on admission, then 12/13- was transfused another 2 units, Hb stable since -EGD negative, appreciate GI assistance, colonoscopy as above  #3. Hypokalemia - probably from poor oral intake. Resolved  #4. History of Parkinson's disease - continue home medications.   #5 leukocytosis/Probable PNA  -CT scan of abdomen and pelvis with bilateral lower airspace disease suspicious for pulmonary edema or pneumonia  -continue empiric antibiotics and follow. --continue current abx- stopped Levaquin, leukocytosis resolved -pulm toilet  Code Status: FULL Family Communication: Wife at bedside Disposition Plan: home when medically ready with hh services   Consultants:  GI consulted-eval pending  Procedures:  EGD 12/13-negative -Status post colonoscopy 12/14 revealed rectal mass -s/p rigid sigmoidoscopy at bedside 12/16 per surgery   Antibiotics:  levaquin started on 12/14  HPI/Subjective: Feels well, poor PO, no BM yet, Some abdominal discomfort , denies any new complaints.  Objective: Filed Vitals:   01/09/12 0519 01/09/12 1513 01/09/12 2155 01/10/12 0522  BP: 135/57 149/48 161/60 138/68  Pulse: 71 78 73 70  Temp: 97.3 F (36.3 C) 98.2 F (36.8 C) 97.7 F (36.5 C) 97.8 F (36.6 C)  TempSrc: Oral Oral Oral Oral  Resp: 18 18 20 20   Height:      Weight:      SpO2: 92% 99% 97% 96%    Intake/Output Summary (Last 24 hours)  at 01/10/12 1039 Last data filed at 01/10/12 0915  Gross per 24 hour  Intake 2011.67 ml  Output    250 ml  Net 1761.67 ml   Filed Weights   12/30/11 2246  Weight: 62.8 kg (138 lb 7.2 oz)    Exam:   General:  Awake, pleasant, hard to comprehend, no distress  Cardiovascular: Regular rate and rhythm, normal S1-S2  Respiratory: Decreased breath sounds at bases, otherwise clear to auscultation  Abdomen: Soft, distended, decreased bowel sounds present midline with dressing clean and dry.   Ext: no edema c/c  Data Reviewed: Basic Metabolic Panel:  Lab 01/07/12 8119 01/06/12 0600 01/04/12 0500  NA 137 136 138  K 3.7 3.8 3.7  CL 104 103 106  CO2 30 21 23   GLUCOSE 95 102* 86  BUN 7 8 9   CREATININE 0.60 0.59 0.63  CALCIUM 8.1* 8.1* 8.3*  MG -- -- --  PHOS -- -- --   Liver Function Tests: No results found for this basename: AST:5,ALT:5,ALKPHOS:5,BILITOT:5,PROT:5,ALBUMIN:5 in the last 168 hours No results found for this basename: LIPASE:5,AMYLASE:5 in the last 168 hours No results found for this basename: AMMONIA:5 in the last 168 hours CBC:  Lab 01/10/12 0635 01/07/12 0430 01/06/12 0600 01/04/12 0500  WBC 3.9* 5.9 8.2 5.2  NEUTROABS -- -- -- --  HGB 9.1* 9.1* 9.6* 9.3*  HCT 29.9* 31.0* 32.8* 31.8*  MCV 75.9* 76.0* 76.8* 74.1*  PLT 142* 151 167 208   Cardiac Enzymes: No results found for this basename: CKTOTAL:5,CKMB:5,CKMBINDEX:5,TROPONINI:5 in the last 168 hours BNP (last 3 results)  Basename 01/02/12 0522  PROBNP 1043.0*   CBG:  Lab 01/09/12 1722  GLUCAP 105*  Recent Results (from the past 240 hour(s))  SURGICAL PCR SCREEN     Status: Normal   Collection Time   01/04/12  6:08 PM      Component Value Range Status Comment   MRSA, PCR NEGATIVE  NEGATIVE Final    Staphylococcus aureus NEGATIVE  NEGATIVE Final      Studies: No results found.  Scheduled Meds:    . heparin subcutaneous  5,000 Units Subcutaneous Q8H  . pantoprazole  40 mg Oral Q1200    Continuous Infusions:    Principal Problem:  *Anemia Active Problems:  GI bleed  Parkinson's disease  Hypokalemia  Leukocytosis  Rectal mass  Adenocarcinoma of colon    Time spent: 30    [facility]  [facility] Pager [phone]. If 8PM-8AM, please contact night-coverage at [email or web address], password [facility] 01/10/2012, 10:39 AM  LOS: 11 days

## 2012-01-10 NOTE — Progress Notes (Signed)
Patient ID: Cole Cunningham, male   DOB: 22-Nov-1932, 78 y.o.   MRN: [id number] 5 Days Post-Op  Subjective: Up in chair.  Taking some full liquids but not much. Having some nausea and some abd pain  Objective: Vital signs in last 24 hours: Temp:  [97.7 F (36.5 C)-98.2 F (36.8 C)] 97.8 F (36.6 C) (12/22 0522) Pulse Rate:  [70-78] 70  (12/22 0522) Resp:  [18-20] 20  (12/22 0522) BP: (138-161)/(48-68) 138/68 mmHg (12/22 0522) SpO2:  [96 %-99 %] 96 % (12/22 0522) Last BM Date: 01/04/12  Intake/Output from previous day: 12/21 0701 - 12/22 0700 In: 2111.7 [P.O.:360; I.V.:1751.7] Out: 250 [Urine:250] Intake/Output this shift: Total I/O In: 20 [P.O.:20] Out: -   General appearance: alert, cooperative and no distress GI: Mild distention, soft, mild left side tenderness without guarding Incision/Wound: Clean and dry without signs of infection  Lab Results:   Basename 01/10/12 0635  WBC 3.9*  HGB 9.1*  HCT 29.9*  PLT 142*   BMET No results found for this basename: NA:2,K:2,CL:2,CO2:2,GLUCOSE:2,BUN:2,CREATININE:2,CALCIUM:2 in the last 72 hours   Studies/Results: No results found.  Anti-infectives: Anti-infectives     Start     Dose/Rate Route Frequency Ordered Stop   01/05/12 1300   ertapenem (INVANZ) 1 g in sodium chloride 0.9 % 50 mL IVPB        1 g 100 mL/hr over 30 Minutes Intravenous To Surgery 01/05/12 1246 01/05/12 1253   01/02/12 2000   levofloxacin (LEVAQUIN) IVPB 750 mg  Status:  Discontinued        750 mg 100 mL/hr over 90 Minutes Intravenous Every 24 hours 01/02/12 1851 01/09/12 0936          Assessment/Plan: s/p Procedure(s): LOW ANTERIOR BOWEL RESECTION Poboably some ileus Cont full liquids as tolerated and observe   LOS: 11 days    Omaira Mellen T 01/10/2012

## 2012-01-11 LAB — CBC
Platelets: 136 10*3/uL — ABNORMAL LOW (ref 150–400)
RBC: 3.63 MIL/uL — ABNORMAL LOW (ref 4.22–5.81)
WBC: 3.3 10*3/uL — ABNORMAL LOW (ref 4.0–10.5)

## 2012-01-11 NOTE — Progress Notes (Signed)
Physical Therapy Treatment Patient Details Name: Cole Cunningham MRN: [id number] DOB: 03-09-1932 Today's Date: 01/11/2012 Time: 4098-1191 PT Time Calculation (min): 24 min  PT Assessment / Plan / Recommendation Comments on Treatment Session  Pt s/p bowel resection and with Parkinson's. Pt making good progress with mobility.    Follow Up Recommendations  Home health PT     Does the patient have the potential to tolerate intense rehabilitation     Barriers to Discharge        Equipment Recommendations  None recommended by PT    Recommendations for Other Services    Frequency Min 3X/week   Plan Discharge plan remains appropriate;Frequency remains appropriate    Precautions / Restrictions Precautions Precautions: Fall   Pertinent Vitals/Pain No signs of pain.    Mobility  Bed Mobility Supine to Sit: 5: Supervision;HOB elevated Sitting - Scoot to Edge of Bed: 4: Min assist Details for Bed Mobility Assistance: Incr time.  Assist to get hips to EOB Transfers Sit to Stand: 4: Min guard;With upper extremity assist;From bed;From chair/3-in-1;With armrests Stand to Sit: 4: Min guard;With upper extremity assist;With armrests;To chair/3-in-1 Details for Transfer Assistance: Incr time to perform Ambulation/Gait Ambulation/Gait Assistance: 4: Min guard;5: Supervision Ambulation Distance (Feet): 175 Feet Assistive device: Rolling walker Ambulation/Gait Assistance Details: verbal cues to stay closer to walker Gait Pattern: Step-through pattern;Decreased stride length;Trunk flexed Gait velocity: decr    Exercises     PT Diagnosis:    PT Problem List:   PT Treatment Interventions:     PT Goals Acute Rehab PT Goals PT Goal: Sit to Stand - Progress: Progressing toward goal PT Goal: Stand to Sit - Progress: Progressing toward goal PT Goal: Ambulate - Progress: Progressing toward goal  Visit Information  Last PT Received On: 01/11/12 Assistance Needed: +1    Subjective  Data  Subjective: Pt said he had been up in the chair already today. Nurse tech let me know pt had not been up in chair today.   Cognition  Overall Cognitive Status: History of cognitive impairments - at baseline Arousal/Alertness: Awake/alert Behavior During Session: [facility] for tasks performed    Balance  Static Standing Balance Static Standing - Balance Support: Bilateral upper extremity supported Static Standing - Level of Assistance: 5: Stand by assistance  End of Session PT - End of Session Equipment Utilized During Treatment: Gait belt Activity Tolerance: Patient tolerated treatment well Patient left: with chair alarm set;with family/visitor present Nurse Communication: Mobility status   GP     Persephanie Laatsch 01/11/2012, 3:05 PM  [employer] PT [phone]

## 2012-01-11 NOTE — Progress Notes (Signed)
[facility] PROGRESS NOTE  Cole Cunningham GNF:[id number] DOB: Feb 07, 1932 DOA: 12/30/2011 PCP: Claude Manges, FNP  Assessment/Plan: #1.Adenocarcinoma, rectosigmoid, Rectal mass-nearly obstructing -CT scan of abdomen and pelvis with no definite evidence of metastatic disease, CEA 1.7(wnl) -s/p Low anterior bowel resection 12/17 -surgery following, diet being advanced per [facility] Path with invasive adenoCA, margins negative, LNs negative  #2. Symptomatic severe anemia secondary to GI bleed -secondary to #1, status post transfusion of 2 units of PRBC on admission, then 12/13- was transfused another 2 units, Hb stable since -EGD negative, appreciate GI assistance, colonoscopy as above  #3. Hypokalemia - probably from poor oral intake. Resolved  #4. History of Parkinson's disease - continue home medications.   #5 leukocytosis/Probable PNA  -CT scan of abdomen and pelvis with bilateral lower airspace disease suspicious for pulmonary edema or pneumonia  -continue empiric antibiotics and follow. --continue current abx- stopped Levaquin, leukocytosis resolved -pulm toilet  Code Status: FULL Family Communication: Wife at bedside Disposition Plan: home when medically ready with hh services   Consultants:  GI consulted-eval pending  Procedures:  EGD 12/13-negative -Status post colonoscopy 12/14 revealed rectal mass -s/p rigid sigmoidoscopy at bedside 12/16 per surgery   Antibiotics:  levaquin started on 12/14  HPI/Subjective: Feels well, poor PO, multiple BMs yesterday, Some abdominal discomfort , denies any new complaints.  Objective: Filed Vitals:   01/10/12 0522 01/10/12 1412 01/10/12 2126 01/11/12 0614  BP: 138/68 148/70 167/71 164/64  Pulse: 70 72 75 62  Temp: 97.8 F (36.6 C) 98.3 F (36.8 C) 98.1 F (36.7 C) 97.7 F (36.5 C)  TempSrc: Oral Oral Oral Oral  Resp: 20 18 20 18   Height:      Weight:      SpO2: 96% 95% 96% 97%    Intake/Output Summary  (Last 24 hours) at 01/11/12 1346 Last data filed at 01/10/12 2149  Gross per 24 hour  Intake      3 ml  Output      0 ml  Net      3 ml   Filed Weights   12/30/11 2246  Weight: 62.8 kg (138 lb 7.2 oz)    Exam:   General:  Awake, pleasant, hard to comprehend, no distress  Cardiovascular: Regular rate and rhythm, normal S1-S2  Respiratory: Decreased breath sounds at bases, otherwise clear to auscultation  Abdomen: Soft, distended, decreased bowel sounds present midline with dressing clean and dry.   Ext: no edema c/c  Data Reviewed: Basic Metabolic Panel:  Lab 01/07/12 8469 01/06/12 0600  NA 137 136  K 3.7 3.8  CL 104 103  CO2 30 21  GLUCOSE 95 102*  BUN 7 8  CREATININE 0.60 0.59  CALCIUM 8.1* 8.1*  MG -- --  PHOS -- --   Liver Function Tests: No results found for this basename: AST:5,ALT:5,ALKPHOS:5,BILITOT:5,PROT:5,ALBUMIN:5 in the last 168 hours No results found for this basename: LIPASE:5,AMYLASE:5 in the last 168 hours No results found for this basename: AMMONIA:5 in the last 168 hours CBC:  Lab 01/11/12 0512 01/10/12 1540 01/10/12 0635 01/07/12 0430 01/06/12 0600  WBC 3.3* 3.6* 3.9* 5.9 8.2  NEUTROABS -- -- -- -- --  HGB 8.3* 9.3* 9.1* 9.1* 9.6*  HCT 27.7* 31.2* 29.9* 31.0* 32.8*  MCV 76.3* 76.5* 75.9* 76.0* 76.8*  PLT 136* 167 142* 151 167   Cardiac Enzymes: No results found for this basename: CKTOTAL:5,CKMB:5,CKMBINDEX:5,TROPONINI:5 in the last 168 hours BNP (last 3 results)  Basename 01/02/12 0522  PROBNP 1043.0*   CBG:  Lab 01/09/12 1722  GLUCAP 105*    Recent Results (from the past 240 hour(s))  SURGICAL PCR SCREEN     Status: Normal   Collection Time   01/04/12  6:08 PM      Component Value Range Status Comment   MRSA, PCR NEGATIVE  NEGATIVE Final    Staphylococcus aureus NEGATIVE  NEGATIVE Final      Studies: No results found.  Scheduled Meds:    . pantoprazole  40 mg Oral Q1200  . sodium chloride  3 mL Intravenous Q12H    Continuous Infusions:    Principal Problem:  *Anemia Active Problems:  GI bleed  Parkinson's disease  Hypokalemia  Leukocytosis  Rectal mass  Adenocarcinoma of colon    Time spent: 30    [facility]  [facility] Pager [phone]. If 8PM-8AM, please contact night-coverage at [email or web address], password [facility] 01/11/2012, 1:46 PM  LOS: 12 days

## 2012-01-11 NOTE — Progress Notes (Signed)
Agree with A&P of JD, ANP. Patient appears to be making slow progress, but progress none the less.  Will recheck labs in am

## 2012-01-11 NOTE — Progress Notes (Signed)
Patient ID: Cole Cunningham, male   DOB: 09-Dec-1932, 78 y.o.   MRN: [id number] 6 Days Post-Op  Subjective: Feels good this morning, states that he had a BM this am. Abdomen is soft, non tender, mildly distended, + BS. Tolerating full liquids.  Objective: Vital signs in last 24 hours: Temp:  [97.7 F (36.5 C)-98.3 F (36.8 C)] 97.7 F (36.5 C) (12/23 0614) Pulse Rate:  [62-75] 62  (12/23 0614) Resp:  [18-20] 18  (12/23 0614) BP: (148-167)/(64-71) 164/64 mmHg (12/23 0614) SpO2:  [95 %-97 %] 97 % (12/23 0614) Last BM Date: 01/10/12  Intake/Output from previous day: 12/22 0701 - 12/23 0700 In: 23 [P.O.:20; I.V.:3] Out: -  Intake/Output this shift:    General apperance: A/A/O NAD Abdomen: minimally distended, dressing C/D/I, soft, non tender. + BS, BM VSS, afebrile  Lab Results:   [facility] 01/11/12 0512 01/10/12 1540  WBC 3.3* 3.6*  HGB 8.3* 9.3*  HCT 27.7* 31.2*  PLT 136* 167   BMET No results found for this basename: NA:2,K:2,CL:2,CO2:2,GLUCOSE:2,BUN:2,CREATININE:2,CALCIUM:2 in the last 72 hours   Studies/Results: No results found.  Anti-infectives: Anti-infectives     Start     Dose/Rate Route Frequency Ordered Stop   01/05/12 1300   ertapenem (INVANZ) 1 g in sodium chloride 0.9 % 50 mL IVPB        1 g 100 mL/hr over 30 Minutes Intravenous To Surgery 01/05/12 1246 01/05/12 1253   01/02/12 2000   levofloxacin (LEVAQUIN) IVPB 750 mg  Status:  Discontinued        750 mg 100 mL/hr over 90 Minutes Intravenous Every 24 hours 01/02/12 1851 01/09/12 0936          Assessment/Plan: s/p Procedure(s): LOW ANTERIOR BOWEL RESECTION Ileus (appears to be resolved)  Cont full liquids as tolerated and observe OOB/ambulate with assistance.   LOS: 12 days    [facility]a [facility] Pager # [phone] 01/11/2012

## 2012-01-12 LAB — CBC
HCT: 29.3 % — ABNORMAL LOW (ref 39.0–52.0)
Hemoglobin: 8.8 g/dL — ABNORMAL LOW (ref 13.0–17.0)
MCHC: 30 g/dL (ref 30.0–36.0)
Platelets: 185 10*3/uL (ref 150–400)
RBC: 3.86 MIL/uL — ABNORMAL LOW (ref 4.22–5.81)
WBC: 3.2 10*3/uL — ABNORMAL LOW (ref 4.0–10.5)

## 2012-01-12 MED ORDER — MORPHINE SULFATE 15 MG PO TABS: 5.0000 mg | ORAL_TABLET | ORAL | Status: DC | PRN

## 2012-01-12 MED ORDER — HYDROMORPHONE HCL 2 MG PO TABS: 2.0000 mg | ORAL_TABLET | ORAL | Status: DC | PRN

## 2012-01-12 MED ORDER — NAPHAZOLINE-GLYCERIN 0.012-0.2 % OP SOLN: 2.0000 [drp] | OPHTHALMIC | Status: DC | PRN

## 2012-01-12 MED ORDER — MORPHINE SULFATE 15 MG PO TABS: 15.0000 mg | ORAL_TABLET | ORAL | Status: DC | PRN

## 2012-01-12 NOTE — Progress Notes (Addendum)
[facility] PROGRESS NOTE  Cole Cunningham XBJ:[id number] DOB: Jul 19, 1932 DOA: 12/30/2011 PCP: Claude Manges, FNP  Brief summary 78 year old male with history of Parkinson's disease was found to be increasingly weak lethargic for over one week per wife. Patient was taken to her PCPs office and was found to have a hemoglobin on 5 and was referred to the ER, then found to have iron defi anemia, further workup lead to detection of rectal mass which was adenocarcinoma,  now is s/p low anterior resection 12/17   Assessment/Plan: #1.Adenocarcinoma, rectosigmoid, Rectal mass-nearly obstructing -CT scan of abdomen and pelvis with no definite evidence of metastatic disease, CEA 1.7(wnl) -s/p Low anterior bowel resection 12/17 -surgery following, diet being advanced per [facility] Path with invasive adenoCA, margins negative, LNs negative  #2. Symptomatic severe anemia secondary to GI bleed -secondary to #1, status post transfusion of 2 units of PRBC on admission, then 12/13- was transfused another 2 units, Hb stable since -EGD negative, appreciate GI assistance, colonoscopy as above  #3. Hypokalemia - probably from poor oral intake. Resolved  #4. History of Parkinson's disease - continue home medications.   #5 leukocytosis/Probable PNA  -CT scan of abdomen and pelvis with bilateral lower airspace disease suspicious for pulmonary edema or pneumonia  --completed antibiotics course, levaquin for 7 days -pulm toilet  #6. Parkinsons disease: stable  #7. CVA-h/o resume ASA in few weeks if no further bleeding  Code Status: FULL Family Communication: Wife at bedside Disposition Plan:  Discussed with [facility], recommended DC tomorrow if stable if tolerates regular diet   Consultants:  GI   [facility] Dr.Hoxworth  Procedures:  EGD 12/13-negative -Status post colonoscopy 12/14 revealed rectal mass -s/p rigid sigmoidoscopy at bedside 12/16 per surgery - Low anterior resection  12/17  HPI/Subjective: Feels well,  Tolerating full liquids , denies any new complaints.  Objective: Filed Vitals:   01/11/12 0614 01/11/12 1358 01/11/12 2217 01/12/12 0657  BP: 164/64 139/71 143/66 149/68  Pulse: 62 92 71 60  Temp: 97.7 F (36.5 C) 98 F (36.7 C) 98.5 F (36.9 C) 98.1 F (36.7 C)  TempSrc: Oral Oral Oral Oral  Resp: 18 18 20 20   Height:      Weight:      SpO2: 97% 96% 96% 97%    Intake/Output Summary (Last 24 hours) at 01/12/12 1427 Last data filed at 01/11/12 2328  Gross per 24 hour  Intake     60 ml  Output      1 ml  Net     59 ml   Filed Weights   12/30/11 2246  Weight: 62.8 kg (138 lb 7.2 oz)    Exam:   General:  Awake, pleasant, hard to comprehend, no distress  Cardiovascular: Regular rate and rhythm, normal S1-S2  Respiratory: Decreased breath sounds at bases, otherwise clear to auscultation  Abdomen: Soft, distended, decreased bowel sounds present midline with dressing clean and dry.   Ext: no edema c/c  Data Reviewed: Basic Metabolic Panel:  Lab 01/07/12 3086 01/06/12 0600  NA 137 136  K 3.7 3.8  CL 104 103  CO2 30 21  GLUCOSE 95 102*  BUN 7 8  CREATININE 0.60 0.59  CALCIUM 8.1* 8.1*  MG -- --  PHOS -- --   Liver Function Tests: No results found for this basename: AST:5,ALT:5,ALKPHOS:5,BILITOT:5,PROT:5,ALBUMIN:5 in the last 168 hours No results found for this basename: LIPASE:5,AMYLASE:5 in the last 168 hours No results found for this basename: AMMONIA:5 in the last 168 hours CBC:  Lab  01/12/12 0610 01/11/12 0512 01/10/12 1540 01/10/12 0635 01/07/12 0430  WBC 3.2* 3.3* 3.6* 3.9* 5.9  NEUTROABS -- -- -- -- --  HGB 8.8* 8.3* 9.3* 9.1* 9.1*  HCT 29.3* 27.7* 31.2* 29.9* 31.0*  MCV 75.9* 76.3* 76.5* 75.9* 76.0*  PLT 185 136* 167 142* 151   Cardiac Enzymes: No results found for this basename: CKTOTAL:5,CKMB:5,CKMBINDEX:5,TROPONINI:5 in the last 168 hours BNP (last 3 results)  Basename 01/02/12 0522  PROBNP 1043.0*    CBG:  Lab 01/09/12 1722  GLUCAP 105*    Recent Results (from the past 240 hour(s))  SURGICAL PCR SCREEN     Status: Normal   Collection Time   01/04/12  6:08 PM      Component Value Range Status Comment   MRSA, PCR NEGATIVE  NEGATIVE Final    Staphylococcus aureus NEGATIVE  NEGATIVE Final      Studies: No results found.  Scheduled Meds:    . pantoprazole  40 mg Oral Q1200  . sodium chloride  3 mL Intravenous Q12H   Continuous Infusions:    Principal Problem:  *Anemia Active Problems:  GI bleed  Parkinson's disease  Hypokalemia  Leukocytosis  Rectal mass  Adenocarcinoma of colon    Time spent: 30    [facility]  [facility] Pager [phone]. If 8PM-8AM, please contact night-coverage at [email or web address], password [facility] 01/12/2012, 2:27 PM  LOS: 13 days

## 2012-01-12 NOTE — Progress Notes (Signed)
Occupational Therapy Treatment Patient Details Name: Cole Cunningham MRN: [id number] DOB: September 29, 1932 Today's Date: 01/12/2012 Time: 4098-1191 OT Time Calculation (min): 28 min  OT Assessment / Plan / Recommendation Comments on Treatment Session Pt making progress. Will benefit from [facility]. Pt's son should be able to complete all mobility at this level.    Follow Up Recommendations  Home health OT;Supervision/Assistance - 24 hour    Barriers to Discharge   none    Equipment Recommendations  None recommended by OT    Recommendations for Other Services  none  Frequency Min 2X/week   Plan Discharge plan remains appropriate    Precautions / Restrictions Precautions Precautions: Fall Restrictions Weight Bearing Restrictions: No   Pertinent Vitals/Pain No c/o pain    ADL  Grooming: Min guard Where Assessed - Grooming: Unsupported sitting Upper Body Bathing: Set up Where Assessed - Upper Body Bathing: Unsupported sitting Toileting - Clothing Manipulation and Hygiene: Moderate assistance Where Assessed - Toileting Clothing Manipulation and Hygiene: Standing Equipment Used: Rolling walker;Gait belt Transfers/Ambulation Related to ADLs: min A ADL Comments: functional transfers. tactile cues for initiation.     OT Diagnosis:    OT Problem List:   OT Treatment Interventions:     OT Goals Acute Rehab OT Goals OT Goal Formulation: With patient Time For Goal Achievement: 01/19/12 Potential to Achieve Goals: Good ADL Goals Pt Will Perform Grooming: with supervision;Standing at sink;Sitting at sink ADL Goal: Grooming - Progress: Met Pt Will Perform Upper Body Dressing: with set-up;Sitting, chair;Sitting, bed ADL Goal: Upper Body Dressing - Progress: Met Pt Will Perform Lower Body Dressing: Sit to stand from chair;with supervision;Sit to stand from bed ADL Goal: Lower Body Dressing - Progress: Progressing toward goals Pt Will Transfer to Toilet: with  supervision;Ambulation;with DME ADL Goal: Toilet Transfer - Progress: Progressing toward goals Pt Will Perform Toileting - Clothing Manipulation: Independently;Standing ADL Goal: Toileting - Clothing Manipulation - Progress: Progressing toward goals Pt Will Perform Toileting - Hygiene: with modified independence;Sitting on 3-in-1 or toilet ADL Goal: Toileting - Hygiene - Progress: Progressing toward goals Pt Will Perform Tub/Shower Transfer: with min assist;Ambulation;with DME  Visit Information  Last OT Received On: 01/12/12 Assistance Needed: +1    Subjective Data      Prior Functioning       Cognition  Overall Cognitive Status: History of cognitive impairments - at baseline Arousal/Alertness: Awake/alert Orientation Level: Appears intact for tasks assessed Behavior During Session: [facility] for tasks performed Cognition - Other Comments: pt emotionally labile, cries due to h/o stroke    Mobility  Shoulder Instructions Bed Mobility Bed Mobility: Supine to Sit;Sitting - Scoot to Edge of Bed Supine to Sit: 4: Min assist Sitting - Scoot to [place] of Bed: 4: Min assist Details for Bed Mobility Assistance: assist to translate hips anteriorly Transfers Transfers: Sit to Stand;Stand to Sit Sit to Stand: 4: Min guard;With upper extremity assist;From bed Stand to Sit: 4: Min guard;With upper extremity assist;To bed Details for Transfer Assistance: increased tmie       Exercises      Balance  minguard   End of Session OT - End of Session Equipment Utilized During Treatment: Gait belt Activity Tolerance: Patient tolerated treatment well Patient left: in bed;with call bell/phone within reach Nurse Communication: Mobility status  GO     Acadia Thammavong,HILLARY 01/12/2012, 4:51 PM [facility], OTR/L  [phone] 01/12/2012

## 2012-01-12 NOTE — Progress Notes (Signed)
[facility] PROGRESS NOTE  Cole Cunningham EXB:[id number] DOB: January 10, 1933 DOA: 12/30/2011 PCP: Claude Manges, FNP  Assessment/Plan: #1.Adenocarcinoma, rectosigmoid, Rectal mass-nearly obstructing -CT scan of abdomen and pelvis with no definite evidence of metastatic disease, CEA 1.7(wnl) -s/p Low anterior bowel resection 12/17 -surgery following, diet being advanced per [facility] Path with invasive adenoCA, margins negative, LNs negative  #2. Symptomatic severe anemia secondary to GI bleed -secondary to #1, status post transfusion of 2 units of PRBC on admission, then 12/13- was transfused another 2 units, Hb stable since -EGD negative, appreciate GI assistance, colonoscopy as above  #3. Hypokalemia - probably from poor oral intake. Resolved  #4. History of Parkinson's disease - continue home medications.   #5 leukocytosis/Probable PNA  -CT scan of abdomen and pelvis with bilateral lower airspace disease suspicious for pulmonary edema or pneumonia  --completed antibiotics course, levaquin for 7 days -pulm toilet  Code Status: FULL Family Communication: Wife at bedside Disposition Plan: home when medically ready with hh services   Consultants:  GI consulted-eval pending  Procedures:  EGD 12/13-negative -Status post colonoscopy 12/14 revealed rectal mass -s/p rigid sigmoidoscopy at bedside 12/16 per surgery   Antibiotics:  levaquin started on 12/14  HPI/Subjective: Feels well,  PO intake low , denies any new complaints.  Objective: Filed Vitals:   01/11/12 0614 01/11/12 1358 01/11/12 2217 01/12/12 0657  BP: 164/64 139/71 143/66 149/68  Pulse: 62 92 71 60  Temp: 97.7 F (36.5 C) 98 F (36.7 C) 98.5 F (36.9 C) 98.1 F (36.7 C)  TempSrc: Oral Oral Oral Oral  Resp: 18 18 20 20   Height:      Weight:      SpO2: 97% 96% 96% 97%    Intake/Output Summary (Last 24 hours) at 01/12/12 0844 Last data filed at 01/11/12 2328  Gross per 24 hour  Intake    160  ml  Output      1 ml  Net    159 ml   Filed Weights   12/30/11 2246  Weight: 62.8 kg (138 lb 7.2 oz)    Exam:   General:  Awake, pleasant, hard to comprehend, no distress  Cardiovascular: Regular rate and rhythm, normal S1-S2  Respiratory: Decreased breath sounds at bases, otherwise clear to auscultation  Abdomen: Soft, distended, decreased bowel sounds present midline with dressing clean and dry.   Ext: no edema c/c  Data Reviewed: Basic Metabolic Panel:  Lab 01/07/12 1027 01/06/12 0600  NA 137 136  K 3.7 3.8  CL 104 103  CO2 30 21  GLUCOSE 95 102*  BUN 7 8  CREATININE 0.60 0.59  CALCIUM 8.1* 8.1*  MG -- --  PHOS -- --   Liver Function Tests: No results found for this basename: AST:5,ALT:5,ALKPHOS:5,BILITOT:5,PROT:5,ALBUMIN:5 in the last 168 hours No results found for this basename: LIPASE:5,AMYLASE:5 in the last 168 hours No results found for this basename: AMMONIA:5 in the last 168 hours CBC:  Lab 01/12/12 0610 01/11/12 0512 01/10/12 1540 01/10/12 0635 01/07/12 0430  WBC 3.2* 3.3* 3.6* 3.9* 5.9  NEUTROABS -- -- -- -- --  HGB 8.8* 8.3* 9.3* 9.1* 9.1*  HCT 29.3* 27.7* 31.2* 29.9* 31.0*  MCV 75.9* 76.3* 76.5* 75.9* 76.0*  PLT 185 136* 167 142* 151   Cardiac Enzymes: No results found for this basename: CKTOTAL:5,CKMB:5,CKMBINDEX:5,TROPONINI:5 in the last 168 hours BNP (last 3 results)  Basename 01/02/12 0522  PROBNP 1043.0*   CBG:  Lab 01/09/12 1722  GLUCAP 105*    Recent Results (from the  past 240 hour(s))  SURGICAL PCR SCREEN     Status: Normal   Collection Time   01/04/12  6:08 PM      Component Value Range Status Comment   MRSA, PCR NEGATIVE  NEGATIVE Final    Staphylococcus aureus NEGATIVE  NEGATIVE Final      Studies: No results found.  Scheduled Meds:    . pantoprazole  40 mg Oral Q1200  . sodium chloride  3 mL Intravenous Q12H   Continuous Infusions:    Principal Problem:  *Anemia Active Problems:  GI bleed  Parkinson's  disease  Hypokalemia  Leukocytosis  Rectal mass  Adenocarcinoma of colon    Time spent: 30    [facility]  [facility] Pager [phone]. If 8PM-8AM, please contact night-coverage at [email or web address], password [facility] 01/12/2012, 8:44 AM  LOS: 13 days

## 2012-01-12 NOTE — Progress Notes (Signed)
Physical Therapy Treatment Patient Details Name: BROADUS COSTILLA MRN: [id number] DOB: 1932/07/16 Today's Date: 01/12/2012 Time: 4098-1191 PT Time Calculation (min): 17 min  PT Assessment / Plan / Recommendation Comments on Treatment Session  Pt s/p bowel resection and with Parkinson's.  Pt continues to make slow steady progress.    Follow Up Recommendations  Home health PT     Does the patient have the potential to tolerate intense rehabilitation     Barriers to Discharge        Equipment Recommendations  None recommended by PT    Recommendations for Other Services    Frequency Min 3X/week   Plan Discharge plan remains appropriate;Frequency remains appropriate    Precautions / Restrictions Precautions Precautions: Fall   Pertinent Vitals/Pain No signs of pain    Mobility  Bed Mobility Bed Mobility: Sit to Supine Supine to Sit: 5: Supervision Sitting - Scoot to Edge of Bed: 4: Min assist Sit to Supine: 3: Mod assist Details for Bed Mobility Assistance: assist to get hips to EOB and to get feet back up when returning to supine. Transfers Sit to Stand: 4: Min guard;With upper extremity assist;From bed (Walker stabilized so pt can pull up on it.) Stand to Sit: 4: Min guard;To bed Details for Transfer Assistance: incr time. Ambulation/Gait Ambulation/Gait Assistance: 4: Min guard Ambulation Distance (Feet): 175 Feet Assistive device: Rolling walker Ambulation/Gait Assistance Details: verbal cues to stay closer to walker and to manage walker on turns. Gait Pattern: Step-through pattern;Decreased stride length;Trunk flexed Gait velocity: decr    Exercises     PT Diagnosis:    PT Problem List:   PT Treatment Interventions:     PT Goals Acute Rehab PT Goals PT Goal: Sit to Supine/Side - Progress: Progressing toward goal PT Goal: Sit to Stand - Progress: Progressing toward goal PT Goal: Stand to Sit - Progress: Progressing toward goal PT Goal: Ambulate -  Progress: Progressing toward goal  Visit Information  Last PT Received On: 01/12/12 Assistance Needed: +1    Subjective Data  Subjective: Pt shaking his head no about sitting up in chair.   Cognition  Overall Cognitive Status: History of cognitive impairments - at baseline Arousal/Alertness: Awake/alert Behavior During Session: [facility] for tasks performed    Balance  Static Standing Balance Static Standing - Balance Support: Bilateral upper extremity supported (on walker) Static Standing - Level of Assistance: 5: Stand by assistance  End of Session PT - End of Session Equipment Utilized During Treatment: Gait belt Activity Tolerance: Patient tolerated treatment well Patient left: with call bell/phone within reach;with bed alarm set Nurse Communication: Mobility status   GP     Acadia [place] 01/12/2012, 10:54 AM  Skip Mayer PT [phone]

## 2012-01-12 NOTE — Progress Notes (Signed)
Looks ok.  Needs to eat more.

## 2012-01-12 NOTE — Progress Notes (Signed)
Patient ID: Cole Cunningham, male   DOB: May 31, 1932, 78 y.o.   MRN: [id number] 7 Days Post-Op  Subjective: Feels good this morning, states that he had a BM this am. Abdomen is soft, non tender, + BS Tolerating full liquids.  Objective: Vital signs in last 24 hours: Temp:  [98 F (36.7 C)-98.5 F (36.9 C)] 98.1 F (36.7 C) (12/24 0657) Pulse Rate:  [60-92] 60  (12/24 0657) Resp:  [18-20] 20  (12/24 0657) BP: (139-149)/(66-71) 149/68 mmHg (12/24 0657) SpO2:  [96 %-97 %] 97 % (12/24 0657) Last BM Date: 01/11/12  Intake/Output from previous day: 12/23 0701 - 12/24 0700 In: 160 [P.O.:160] Out: 1 [Stool:1] Intake/Output this shift:    General apperance: A/A/O NAD Abdomen: minimally distended, dressing C/D/I, soft, non tender. + BS, BM VSS, afebrile  Lab Results:   [facility] 01/12/12 0610 01/11/12 0512  WBC 3.2* 3.3*  HGB 8.8* 8.3*  HCT 29.3* 27.7*  PLT 185 136*   BMET No results found for this basename: NA:2,K:2,CL:2,CO2:2,GLUCOSE:2,BUN:2,CREATININE:2,CALCIUM:2 in the last 72 hours   Studies/Results: No results found.  Anti-infectives: Anti-infectives     Start     Dose/Rate Route Frequency Ordered Stop   01/05/12 1300   ertapenem (INVANZ) 1 g in sodium chloride 0.9 % 50 mL IVPB        1 g 100 mL/hr over 30 Minutes Intravenous To Surgery 01/05/12 1246 01/05/12 1253   01/02/12 2000   levofloxacin (LEVAQUIN) IVPB 750 mg  Status:  Discontinued        750 mg 100 mL/hr over 90 Minutes Intravenous Every 24 hours 01/02/12 1851 01/09/12 0936          Assessment/Plan: s/p Procedure(s): LOW ANTERIOR BOWEL RESECTION Will advance diet OOB/ambulate with assistance. ? Discharge soon   LOS: 13 days    [facility] Pager # [phone] 01/12/2012

## 2012-01-12 NOTE — Progress Notes (Signed)
01/12/12 1300  Clinical Encounter Type  Visited With Patient and family together  Visit Type Follow-up;Post-op;Spiritual support   Followed up with patient and family. Provided emotional and spiritual support. Veryl Speak

## 2012-01-13 MED ORDER — OXYCODONE-ACETAMINOPHEN 5-325 MG PO TABS: 1.0000 | ORAL_TABLET | ORAL | Status: DC | PRN

## 2012-01-13 MED ORDER — NAPHAZOLINE-PHENIRAMINE 0.025-0.3 % OP SOLN
2.0000 [drp] | OPHTHALMIC | Status: DC | PRN
  Administered 2012-01-13: 2 [drp] via OPHTHALMIC
  Filled 2012-01-13: qty 15

## 2012-01-13 NOTE — Progress Notes (Signed)
Agree with above, if leaves today will have staples removed

## 2012-01-13 NOTE — Discharge Summary (Signed)
Physician Discharge Summary  Alok Minshall [facility] ZOX:[id number] DOB: [birth year] DOA: 12/30/2011  PCP: Claude Manges, FNP  Admit date: 12/30/2011 Discharge date: 01/13/2012  Time spent: 40 minutes  Recommendations for Outpatient Follow-up:  1. Follow up with [facility] in 2-3 Weeks.  Discharge Diagnoses:  Principal Problem:  *Anemia Active Problems:  GI bleed  Parkinson's disease  Hypokalemia  Leukocytosis  Rectal mass  Adenocarcinoma of colon   Discharge Condition: Stable  Diet recommendation: Regular  Filed Weights   12/30/11 2246  Weight: 62.8 kg (138 lb 7.2 oz)    History of present illness:  78 year old male with history of Parkinson's disease was found to be increasingly weak lethargic over the last one week as witnessed by patient's wife. Patient was taken to her PCPs office and was found to have a hemoglobin on 5 and was referred to the ER. In the ER repeat hemoglobin was around 5 and patient was admitted from a further management. Patient's wife did not notice any blood in the stool or black stools and patient does not take any NSAIDs. Patient's stool for occult blood was positive and patient has been admitted for further management. Patient is presently hemodynamically stable.   Hospital Course:   1. Adenocarcinoma of the rectosigmoid colon: Patient presented to the hospital with iron deficiency anemia, EEG showed normal stomach, colonoscopy was done and showed nearly obstructing rectal mass. The pathology came back for invasive adenocarcinoma, and the CT scan of abdomen pelvis did not show any evidence of metastasis. General surgery was consulted, low anterior resection of the cancer was done on the 01/05/2012 by Dr. Johna Sheriff. The rectal mass about 13 cm from the verge by rigid sigmoidoscopy. The pathology showed margins free of cancer. Since surgery his diet is being advanced, is tolerating diet well without any problems. Patient to followup with Gen.  surgery, discussed with general surgery no need for referral to medical oncology.  2. Iron deficiency anemia: Secondary to chronic blood loss from the colon cancer, patient had symptomatic anemia he had total of 4 units of packed RBCs transfused during this hospital stay. Hemoglobin and stable since surgery. As mentioned above EGD is negative and colonoscopy showed a rectal mass.  3. Hypokalemia: This is likely secondary from poor oral intake, this is repleted orally.  4. History of Parkinson's disease: On medications continued throughout the hospital stay without any changes.  5. Probable pneumonia: Patient had leukocytosis, and CT scan of abdomen and pelvis done for the cancer showed bilateral lower airspace disease suspicious for pneumonia, patient was placed on Levaquin and he completed a successful 7 days of antibiotics.   Procedures:  EGD done on 01/01/2012 by Dr. Randa Evens: showed normal stomach.  Colonoscopy done on 03/05/2011 by Dr. Madilyn Fireman: Showed nearly obstructing rectal mass likely cancer.  Low anterior resection done on 01/05/2012 by Dr. Johna Sheriff for rectal invasive adenocarcinoma.  Consultations:  General surgery  Discharge Exam: Filed Vitals:   01/12/12 0657 01/12/12 1437 01/12/12 2137 01/13/12 0453  BP: 149/68 137/64 130/50 168/66  Pulse: 60 67 60 65  Temp: 98.1 F (36.7 C) 98.6 F (37 C) 98 F (36.7 C) 98.7 F (37.1 C)  TempSrc: Oral Oral Oral Oral  Resp: 20 18 20 18   Height:      Weight:      SpO2: 97% 98% 99% 98%   General: Alert and awake, oriented x3, not in any acute distress. HEENT: anicteric sclera, pupils reactive to light and accommodation, EOMI CVS: S1-S2 clear, no murmur  rubs or gallops Chest: clear to auscultation bilaterally, no wheezing, rales or rhonchi Abdomen: soft nontender, nondistended, normal bowel sounds, no organomegaly Extremities: no cyanosis, clubbing or edema noted bilaterally Neuro: Cranial nerves II-XII intact, no focal  neurological deficits  Discharge Instructions  Discharge Orders    Future Orders Please Complete By Expires   Increase activity slowly          Medication List     As of 01/13/2012 11:23 AM    TAKE these medications         aspirin EC 81 MG tablet   Take 81 mg by mouth daily.      multivitamin with minerals Tabs   Take 1 tablet by mouth daily.      oxyCODONE-acetaminophen 5-325 MG per tablet   Commonly known as: PERCOCET/ROXICET   Take 1 tablet by mouth every 4 (four) hours as needed for pain.           Follow-up Information    Follow up with HOXWORTH,BENJAMIN T, MD. In 3 weeks. (Call to schedule an appointment with [place] in 2-3 weeks.)    Contact information:   [address] [place] [address] [phone]       Follow up with Claude Manges, FNP. In 1 week.          The results of significant diagnostics from this hospitalization (including imaging, microbiology, ancillary and laboratory) are listed below for reference.    Significant Diagnostic Studies: Ct Chest W Contrast  01/02/2012  *RADIOLOGY REPORT*  Clinical Data:  Newly diagnosed rectal carcinoma.  Staging.  CT CHEST, ABDOMEN AND PELVIS WITH CONTRAST  Technique:  Multidetector CT imaging of the chest, abdomen and pelvis was performed following the standard protocol during bolus administration of intravenous contrast.  Contrast: OMNIPAQUE IOHEXOL 300 MG/ML  SOLN, 1 OMNIPAQUE IOHEXOL 300 MG/ML  SOLN  Comparison:   None.  CT CHEST  Findings:  No evidence of mediastinal or hilar masses.  Tiny bilateral pleural effusions are seen.  Bilateral lower lobe airspace disease is also seen which is symmetric and may be due to edema or pneumonia.  Shotty mediastinal lymph nodes are seen which are nonspecific but likely reactive in etiology.  No suspicious pulmonary nodules or masses are identified.  Cardiomegaly is noted, without pericardial effusion.  No evidence of axillary lymphadenopathy or chest wall  mass.  No suspicious bone lesions are identified.  IMPRESSION:  1.  No definite evidence of metastatic disease. 2.  Bilateral lower lobe airspace disease, suspicious for pulmonary edema or pneumonia. 3. Tiny bilateral pleural effusions.  Nonspecific shotty mediastinal lymph nodes which may be reactive in etiology.  CT ABDOMEN AND PELVIS  Findings:  The abdominal parenchymal organs are normal in appearance.  Calcified gallstones are noted, however there is no evidence of cholecystitis or biliary ductal dilatation.  No evidence of hydronephrosis.  Beam hardening artifact from bilateral hip surgical hardware is seen which obscures visualization of the inferior pelvis.  No evidence of pelvic lymphadenopathy or other soft tissue masses. The no evidence of inflammatory process or abnormal fluid collections.  No evidence of bowel obstruction or hernia.  No suspicious bone lesions are identified.  IMPRESSION:  1.  No evidence of metastatic disease. 2.  Cholelithiasis, without evidence of cholecystitis. 3.  Beam hardening artifact through the inferior pelvis due to bilateral hip hardware.   Original Report Authenticated By: Myles Rosenthal, M.D.    Ct Abdomen Pelvis W Contrast  01/02/2012  *RADIOLOGY REPORT*  Clinical Data:  Newly diagnosed rectal carcinoma.  Staging.  CT CHEST, ABDOMEN AND PELVIS WITH CONTRAST  Technique:  Multidetector CT imaging of the chest, abdomen and pelvis was performed following the standard protocol during bolus administration of intravenous contrast.  Contrast: OMNIPAQUE IOHEXOL 300 MG/ML  SOLN, 1 OMNIPAQUE IOHEXOL 300 MG/ML  SOLN  Comparison:   None.  CT CHEST  Findings:  No evidence of mediastinal or hilar masses.  Tiny bilateral pleural effusions are seen.  Bilateral lower lobe airspace disease is also seen which is symmetric and may be due to edema or pneumonia.  Shotty mediastinal lymph nodes are seen which are nonspecific but likely reactive in etiology.  No suspicious pulmonary nodules  or masses are identified.  Cardiomegaly is noted, without pericardial effusion.  No evidence of axillary lymphadenopathy or chest wall mass.  No suspicious bone lesions are identified.  IMPRESSION:  1.  No definite evidence of metastatic disease. 2.  Bilateral lower lobe airspace disease, suspicious for pulmonary edema or pneumonia. 3. Tiny bilateral pleural effusions.  Nonspecific shotty mediastinal lymph nodes which may be reactive in etiology.  CT ABDOMEN AND PELVIS  Findings:  The abdominal parenchymal organs are normal in appearance.  Calcified gallstones are noted, however there is no evidence of cholecystitis or biliary ductal dilatation.  No evidence of hydronephrosis.  Beam hardening artifact from bilateral hip surgical hardware is seen which obscures visualization of the inferior pelvis.  No evidence of pelvic lymphadenopathy or other soft tissue masses. The no evidence of inflammatory process or abnormal fluid collections.  No evidence of bowel obstruction or hernia.  No suspicious bone lesions are identified.  IMPRESSION:  1.  No evidence of metastatic disease. 2.  Cholelithiasis, without evidence of cholecystitis. 3.  Beam hardening artifact through the inferior pelvis due to bilateral hip hardware.   Original Report Authenticated By: Myles Rosenthal, M.D.    Dg Chest Port 1 View  01/03/2012  *RADIOLOGY REPORT*  Clinical Data: Pulmonary infiltrates.  PORTABLE CHEST - 1 VIEW  Comparison: CT scan dated 01/02/2012 and chest x-ray dated 04/24/2011  Findings: Heart size and vascularity are within normal limits. Tiny bilateral effusions.  Faint small infiltrates at both lung bases which correlate with the abnormalities seen on chest CT.  No progression.  IMPRESSION: Tiny effusions and small bilateral lower lobe infiltrates posteriorly.   Original Report Authenticated By: Francene Boyers, M.D.     Microbiology: Recent Results (from the past 240 hour(s))  SURGICAL PCR SCREEN     Status: Normal   Collection  Time   01/04/12  6:08 PM      Component Value Range Status Comment   MRSA, PCR NEGATIVE  NEGATIVE Final    Staphylococcus aureus NEGATIVE  NEGATIVE Final      Labs: Basic Metabolic Panel:  Lab 01/07/12 1610  NA 137  K 3.7  CL 104  CO2 30  GLUCOSE 95  BUN 7  CREATININE 0.60  CALCIUM 8.1*  MG --  PHOS --   Liver Function Tests: No results found for this basename: AST:5,ALT:5,ALKPHOS:5,BILITOT:5,PROT:5,ALBUMIN:5 in the last 168 hours No results found for this basename: LIPASE:5,AMYLASE:5 in the last 168 hours No results found for this basename: AMMONIA:5 in the last 168 hours CBC:  Lab 01/12/12 0610 01/11/12 0512 01/10/12 1540 01/10/12 0635 01/07/12 0430  WBC 3.2* 3.3* 3.6* 3.9* 5.9  NEUTROABS -- -- -- -- --  HGB 8.8* 8.3* 9.3* 9.1* 9.1*  HCT 29.3* 27.7* 31.2* 29.9* 31.0*  MCV 75.9* 76.3*  76.5* 75.9* 76.0*  PLT 185 136* 167 142* 151   Cardiac Enzymes: No results found for this basename: CKTOTAL:5,CKMB:5,CKMBINDEX:5,TROPONINI:5 in the last 168 hours BNP: BNP (last 3 results)  Basename 01/02/12 0522  PROBNP 1043.0*   CBG:  Lab 01/09/12 1722  GLUCAP 105*       Signed:  Krystian Younglove A  [facility] 01/13/2012, 11:23 AM

## 2012-01-13 NOTE — Progress Notes (Signed)
Cole Cunningham discharged Home with H/H RN & PT per MD order.  Discharge instructions reviewed and discussed with the patient, all questions and concerns answered. Copy of instructions and scripts given to patient.  Explained to wife how to sign up for Mychart.   [name], [name]  Home Medication Instructions [id number]   Printed on:01/13/12 1145  Medication Information                    aspirin EC 81 MG tablet Take 81 mg by mouth daily.           Multiple Vitamin (MULTIVITAMIN WITH MINERALS) TABS Take 1 tablet by mouth daily.           oxyCODONE-acetaminophen (ROXICET) 5-325 MG per tablet Take 1 tablet by mouth every 4 (four) hours as needed for pain.             Patients skin is clean, dry, stage 1 to sacrum and wife was aware, midline incision to abd. Staples removed and steri strips applied, abrasion to right hand new dressing applied. IV site discontinued and catheter remains intact. Site without signs and symptoms of complications. Dressing and pressure applied.  Patient escorted to car by NT in a wheelchair,  no distress noted upon discharge.  Laural Benes, Antonyo Hinderer C 01/13/2012 11:45 AM

## 2012-01-13 NOTE — Progress Notes (Signed)
Patient ID: Cole Cunningham, male   DOB: 1932-06-26, 78 y.o.   MRN: [id number] 8 Days Post-Op  Subjective: Feels good this morning,  Abdomen is soft, non tender, + BS Tolerating solid food.  Objective: Vital signs in last 24 hours: Temp:  [98 F (36.7 C)-98.7 F (37.1 C)] 98.7 F (37.1 C) (12/25 0453) Pulse Rate:  [60-67] 65  (12/25 0453) Resp:  [18-20] 18  (12/25 0453) BP: (130-168)/(50-66) 168/66 mmHg (12/25 0453) SpO2:  [98 %-99 %] 98 % (12/25 0453) Last BM Date: 01/11/12  Intake/Output from previous day: 12/24 0701 - 12/25 0700 In: -  Out: 200 [Urine:200] Intake/Output this shift:    General apperance: A/A/O NAD Abdomen: minimally distended, dressing C/D/I, soft, non tender. + BS, tolerating solid food w/o difficulty. VSS, afebrile  Lab Results:   Basename 01/12/12 0610 01/11/12 0512  WBC 3.2* 3.3*  HGB 8.8* 8.3*  HCT 29.3* 27.7*  PLT 185 136*   BMET No results found for this basename: NA:2,K:2,CL:2,CO2:2,GLUCOSE:2,BUN:2,CREATININE:2,CALCIUM:2 in the last 72 hours   Studies/Results: No results found.  Anti-infectives: Anti-infectives     Start     Dose/Rate Route Frequency Ordered Stop   01/05/12 1300   ertapenem (INVANZ) 1 g in sodium chloride 0.9 % 50 mL IVPB        1 g 100 mL/hr over 30 Minutes Intravenous To Surgery 01/05/12 1246 01/05/12 1253   01/02/12 2000   levofloxacin (LEVAQUIN) IVPB 750 mg  Status:  Discontinued        750 mg 100 mL/hr over 90 Minutes Intravenous Every 24 hours 01/02/12 1851 01/09/12 0936          Assessment/Plan: s/p Procedure(s): LOW ANTERIOR BOWEL RESECTION Appears stable from surgical standpoint for discharge to home today. Will need to follow up with our service in 2-3 weeks with Dr. Adriana Mccallum.  LOS: 14 days    Blenda Mounts [facility] Surgery Pager # [phone] 01/13/2012

## 2012-01-15 ENCOUNTER — Telehealth (INDEPENDENT_AMBULATORY_CARE_PROVIDER_SITE_OTHER): Payer: Self-pay | Admitting: General Surgery

## 2012-01-15 NOTE — Telephone Encounter (Signed)
Cole Cunningham with [facility] calling to let us know patient did not fill his percocet but is using ibuprofen and it is controlling his pain.

## 2012-01-21 ENCOUNTER — Ambulatory Visit (INDEPENDENT_AMBULATORY_CARE_PROVIDER_SITE_OTHER): Payer: Medicare Other | Admitting: General Surgery

## 2012-01-21 ENCOUNTER — Encounter (INDEPENDENT_AMBULATORY_CARE_PROVIDER_SITE_OTHER): Payer: Self-pay | Admitting: General Surgery

## 2012-01-21 VITALS — BP 138/74 | HR 76 | Temp 97.6°F | Resp 18 | Ht 66.0 in | Wt 140.0 lb

## 2012-01-21 DIAGNOSIS — C189 Malignant neoplasm of colon, unspecified: Secondary | ICD-10-CM

## 2012-01-21 NOTE — Progress Notes (Signed)
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 Despite multiple medical comorbidities he had a relatively uneventful postoperative course. His wife reports he has been doing well at home with no abdominal or GI complaints. Bowel movements are formed. Appetite is good. No significant abdominal pain.  Exam: BP 138/74  Pulse 76  Temp 97.6 F (36.4 C) (Temporal)  Resp 18  Ht 5\' 6"  (1.676 m)  Wt 140 lb (63.504 kg)  BMI 22.60 kg/m2 General: No distress Abdomen: Soft and nontender. Wound is well healed without complication.  Assessment and plan: Doing well postoperatively without complication identified. He was noted negative and with his age and multiple comorbidities we will not referred to medical oncology. I discussed this with his wife and she is in agreement. I will see him back in 6 weeks for more long-term postop check.

## 2012-03-04 ENCOUNTER — Encounter (INDEPENDENT_AMBULATORY_CARE_PROVIDER_SITE_OTHER): Payer: Medicare Other | Admitting: General Surgery

## 2012-03-05 ENCOUNTER — Other Ambulatory Visit: Payer: Self-pay

## 2012-03-11 ENCOUNTER — Encounter (INDEPENDENT_AMBULATORY_CARE_PROVIDER_SITE_OTHER): Payer: Self-pay | Admitting: General Surgery

## 2012-03-11 ENCOUNTER — Ambulatory Visit (INDEPENDENT_AMBULATORY_CARE_PROVIDER_SITE_OTHER): Payer: Medicare Other | Admitting: General Surgery

## 2012-03-11 VITALS — BP 128/56 | HR 69 | Temp 97.0°F | Ht 65.0 in | Wt 138.0 lb

## 2012-03-11 DIAGNOSIS — C189 Malignant neoplasm of colon, unspecified: Secondary | ICD-10-CM

## 2012-03-11 NOTE — Progress Notes (Signed)
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 He continues to do well. He and his wife deny any abdominal pain or difficulty with bowel movements or other GI complaints.  Exam:  BP 128/56  Pulse 69  Temp(Src) 97 F (36.1 C) (Temporal)  Ht 5\' 5"  (1.651 m)  Wt 138 lb (62.596 kg)  BMI 22.96 kg/m2  SpO2 96% General: Elderly alert male, dysphasic but communicative Abdomen: Midline incision well healed. No hernias. Soft and nontender.  Assessment and plan: Doing very well following colectomy. Chemotherapy is not indicated particularly with his medical comorbidities. I think his routine clinical followup by primary care in this situation is adequate. I were made available as needed.

## 2012-08-24 ENCOUNTER — Other Ambulatory Visit: Payer: Self-pay

## 2012-11-24 ENCOUNTER — Other Ambulatory Visit: Payer: Self-pay

## 2013-08-16 ENCOUNTER — Encounter: Payer: Self-pay | Admitting: *Deleted

## 2014-10-22 ENCOUNTER — Emergency Department (HOSPITAL_COMMUNITY): Payer: Medicare Other

## 2014-10-22 ENCOUNTER — Encounter (HOSPITAL_COMMUNITY): Payer: Self-pay | Admitting: Emergency Medicine

## 2014-10-22 ENCOUNTER — Emergency Department (HOSPITAL_COMMUNITY)
Admission: EM | Admit: 2014-10-22 | Discharge: 2014-10-22 | Disposition: A | Discharge status: Home / Self Care | Payer: Medicare Other | Source: Home / Self Care | Attending: Emergency Medicine | Admitting: Emergency Medicine

## 2014-10-22 DIAGNOSIS — J449 Chronic obstructive pulmonary disease, unspecified: Secondary | ICD-10-CM

## 2014-10-22 DIAGNOSIS — Z8673 Personal history of transient ischemic attack (TIA), and cerebral infarction without residual deficits: Secondary | ICD-10-CM | POA: Insufficient documentation

## 2014-10-22 DIAGNOSIS — I10 Essential (primary) hypertension: Secondary | ICD-10-CM | POA: Insufficient documentation

## 2014-10-22 DIAGNOSIS — R52 Pain, unspecified: Secondary | ICD-10-CM

## 2014-10-22 DIAGNOSIS — Z79899 Other long term (current) drug therapy: Secondary | ICD-10-CM

## 2014-10-22 DIAGNOSIS — Y9389 Activity, other specified: Secondary | ICD-10-CM

## 2014-10-22 DIAGNOSIS — G20 Parkinson's disease: Secondary | ICD-10-CM

## 2014-10-22 DIAGNOSIS — W010XXA Fall on same level from slipping, tripping and stumbling without subsequent striking against object, initial encounter: Secondary | ICD-10-CM

## 2014-10-22 DIAGNOSIS — F419 Anxiety disorder, unspecified: Secondary | ICD-10-CM | POA: Insufficient documentation

## 2014-10-22 DIAGNOSIS — S79912A Unspecified injury of left hip, initial encounter: Secondary | ICD-10-CM

## 2014-10-22 DIAGNOSIS — Y9289 Other specified places as the place of occurrence of the external cause: Secondary | ICD-10-CM

## 2014-10-22 DIAGNOSIS — Z85038 Personal history of other malignant neoplasm of large intestine: Secondary | ICD-10-CM | POA: Insufficient documentation

## 2014-10-22 DIAGNOSIS — Y998 Other external cause status: Secondary | ICD-10-CM | POA: Insufficient documentation

## 2014-10-22 DIAGNOSIS — Z8639 Personal history of other endocrine, nutritional and metabolic disease: Secondary | ICD-10-CM | POA: Insufficient documentation

## 2014-10-22 DIAGNOSIS — M25552 Pain in left hip: Secondary | ICD-10-CM | POA: Diagnosis not present

## 2014-10-22 DIAGNOSIS — F329 Major depressive disorder, single episode, unspecified: Secondary | ICD-10-CM | POA: Insufficient documentation

## 2014-10-22 DIAGNOSIS — Z7982 Long term (current) use of aspirin: Secondary | ICD-10-CM | POA: Insufficient documentation

## 2014-10-22 DIAGNOSIS — J189 Pneumonia, unspecified organism: Secondary | ICD-10-CM | POA: Diagnosis not present

## 2014-10-22 MED ORDER — MORPHINE SULFATE (PF) 4 MG/ML IV SOLN
4.0000 mg | Freq: Once | INTRAVENOUS | Status: AC
  Administered 2014-10-22: 4 mg via INTRAVENOUS
  Filled 2014-10-22: qty 1

## 2014-10-22 MED ORDER — HYDROCODONE-ACETAMINOPHEN 5-325 MG PO TABS: 1.0000 | ORAL_TABLET | Freq: Four times a day (QID) | ORAL | Status: DC | PRN

## 2014-10-22 NOTE — ED Notes (Signed)
Bed: WA03 Expected date:  Expected time:  Means of arrival:  Comments: EMS 81 yo M Rt hip pain/ fall last pm

## 2014-10-22 NOTE — ED Notes (Signed)
Report given to Isaul Landi, RN. Care relinquished.

## 2014-10-22 NOTE — ED Notes (Signed)
Pt BIBA, reports trip and fall last night at 2000. Awoke this morning with L hip pain. EMS reports no shortening or rotation noted. Pt denies LOC, hitting head or anticoagulant medication at home, other than Aspirin daily. Pt hx stroke, at baseline per wife.

## 2014-10-22 NOTE — ED Provider Notes (Signed)
CSN: [id number]     Arrival date & time 10/22/14  1751 History   First MD Initiated Contact with Patient 10/22/14 0701     Chief Complaint  Patient presents with  . Fall  . Hip Pain      HPI Patient presents to the emergency department complaining of a fall since last night.  He was walking with his walker when he tripped and fell to the ground.  His wife reports that this was in the motion of the sitting position.  He reports pain and discomfort in his left hip.  He has a prior arthroplasty of his left hip.  Patient has been able to ambulate with his walker since the fall.  His pain is mild to moderate in severity at this time.  His wife reports no other injuries or complaints.  No altered mental status.  No head injury.   Past Medical History  Diagnosis Date  . Stroke   . Hip fracture   . Parkinson's disease   . COPD (chronic obstructive pulmonary disease)   . Cancer     colon cancer  . Hyperlipidemia   . Anxiety   . Depression   . Hypertension    Past Surgical History  Procedure Laterality Date  . Fracture surgery    . Hernia repair    . Eye surgery    . Esophagogastroduodenoscopy  01/01/2012    Procedure: ESOPHAGOGASTRODUODENOSCOPY (EGD);  Surgeon: Winfield Cunas., MD;  Location: [facility] ENDOSCOPY;  Service: Endoscopy;  Laterality: Left;  . Flexible sigmoidoscopy  01/02/2012    Procedure: FLEXIBLE SIGMOIDOSCOPY;  Surgeon: Missy Sabins, MD;  Location: [place];  Service: Endoscopy;  Laterality: N/A;  . Bowel resection  01/05/2012    Procedure: LOW ANTERIOR BOWEL RESECTION;  Surgeon: Edward Jolly, MD;  Location: [facility] OR;  Service: General;  Laterality: N/A;   Family History  Problem Relation Age of Onset  . Other Neg Hx    Social History  Substance Use Topics  . Smoking status: Former Research scientist (life sciences)  . Smokeless tobacco: Not on file  . Alcohol Use: No    Review of Systems  All other systems reviewed and are negative.     Allergies  Review of patient's  allergies indicates no known allergies.  Home Medications   Prior to Admission medications   Medication Sig Start Date End Date Taking? Authorizing Provider  aspirin EC 81 MG tablet Take 81 mg by mouth daily.    Historical Provider, MD  ibuprofen (ADVIL,MOTRIN) 200 MG tablet Take 200 mg by mouth every 6 (six) hours as needed.    Historical Provider, MD  Multiple Vitamin (MULTIVITAMIN WITH MINERALS) TABS Take 1 tablet by mouth daily.    Historical Provider, MD   BP 160/55 mmHg  Pulse 60  Temp(Src) 98.6 F (37 C) (Oral)  Resp 18  Ht 5\' 4"  (1.626 m)  Wt 140 lb (63.504 kg)  BMI 24.02 kg/m2  SpO2 98% Physical Exam  Constitutional: He is oriented to person, place, and time. He appears well-developed and well-nourished.  HENT:  Head: Normocephalic.  Eyes: EOM are normal.  Neck: Normal range of motion.  Pulmonary/Chest: Effort normal.  Abdominal: He exhibits no distension.  Musculoskeletal: Normal range of motion.  Full range of motion of bilateral hips, knees, ankles.  No obvious deformity of the left hip.  No left leg shortening or rotation noted.  Neurological: He is alert and oriented to person, place, and time.  Psychiatric: He has a  normal mood and affect.  Nursing note and vitals reviewed.   ED Course  Procedures (including critical care time) Labs Review Labs Reviewed - No data to display  Imaging Review Dg Hip Unilat With Pelvis 2-3 Views Left  10/22/2014   CLINICAL DATA:  Left hip pain after falling at home last night.  EXAM: DG HIP (WITH OR WITHOUT PELVIS) 2-3V LEFT  COMPARISON:  CT scan 01/02/2012  FINDINGS: There is no acute fracture or dislocation. The left proximal femoral prosthesis is properly located. Dystrophic calcifications in the soft tissues extending from the lesser trochanter and from the posterior superior aspect of the left acetabulum are unchanged since 2013. Pelvic bones are intact. Compression screw and sideplate in the proximal right femur.  IMPRESSION:  No acute abnormalities.   Electronically Signed   By: Lorriane Shire M.D.   On: 10/22/2014 08:10   I have personally reviewed and evaluated these images and lab results as part of my medical decision-making.   EKG Interpretation   Date/Time:  Monday October 22 2014 08:43:07 EDT Ventricular Rate:  51 PR Interval:  155 QRS Duration: 106 QT Interval:  461 QTC Calculation: 425 R Axis:   -25 Text Interpretation:  Sinus rhythm LVH with secondary repolarization  abnormality Inferior infarct, old No significant change was found  Confirmed by Sudeep Scheibel  MD, Araceli Arango ([id number]) on 10/22/2014 9:11:09 AM      MDM   Final diagnoses:  Pain  Fall    9:12 AM  No fracture. Has been able to ambulate since the fall. Dc home with orthopedic and pcp follow up    Jola Schmidt, MD 10/22/14 [phone]

## 2014-10-25 ENCOUNTER — Emergency Department (HOSPITAL_COMMUNITY): Payer: Medicare Other

## 2014-10-25 ENCOUNTER — Observation Stay (HOSPITAL_COMMUNITY): Payer: Medicare Other

## 2014-10-25 ENCOUNTER — Inpatient Hospital Stay (HOSPITAL_COMMUNITY): Payer: Medicare Other

## 2014-10-25 ENCOUNTER — Encounter (HOSPITAL_COMMUNITY): Payer: Self-pay | Admitting: Emergency Medicine

## 2014-10-25 ENCOUNTER — Inpatient Hospital Stay (HOSPITAL_COMMUNITY)
Admission: EM | Admit: 2014-10-25 | Discharge: 2014-10-27 | DRG: 193 | Disposition: A | Discharge status: Skilled Nursing Facility | Payer: Medicare Other | Attending: Internal Medicine | Admitting: Internal Medicine

## 2014-10-25 DIAGNOSIS — I69920 Aphasia following unspecified cerebrovascular disease: Secondary | ICD-10-CM | POA: Diagnosis not present

## 2014-10-25 DIAGNOSIS — G20 Parkinson's disease: Secondary | ICD-10-CM | POA: Diagnosis present

## 2014-10-25 DIAGNOSIS — Z96642 Presence of left artificial hip joint: Secondary | ICD-10-CM | POA: Diagnosis present

## 2014-10-25 DIAGNOSIS — Z87891 Personal history of nicotine dependence: Secondary | ICD-10-CM | POA: Diagnosis not present

## 2014-10-25 DIAGNOSIS — M25559 Pain in unspecified hip: Secondary | ICD-10-CM

## 2014-10-25 DIAGNOSIS — F329 Major depressive disorder, single episode, unspecified: Secondary | ICD-10-CM | POA: Diagnosis present

## 2014-10-25 DIAGNOSIS — F419 Anxiety disorder, unspecified: Secondary | ICD-10-CM | POA: Diagnosis present

## 2014-10-25 DIAGNOSIS — L8990 Pressure ulcer of unspecified site, unspecified stage: Secondary | ICD-10-CM | POA: Insufficient documentation

## 2014-10-25 DIAGNOSIS — R0902 Hypoxemia: Secondary | ICD-10-CM | POA: Diagnosis present

## 2014-10-25 DIAGNOSIS — E785 Hyperlipidemia, unspecified: Secondary | ICD-10-CM | POA: Diagnosis present

## 2014-10-25 DIAGNOSIS — Z7982 Long term (current) use of aspirin: Secondary | ICD-10-CM | POA: Diagnosis not present

## 2014-10-25 DIAGNOSIS — S72115A Nondisplaced fracture of greater trochanter of left femur, initial encounter for closed fracture: Secondary | ICD-10-CM | POA: Diagnosis present

## 2014-10-25 DIAGNOSIS — Z79899 Other long term (current) drug therapy: Secondary | ICD-10-CM

## 2014-10-25 DIAGNOSIS — W010XXA Fall on same level from slipping, tripping and stumbling without subsequent striking against object, initial encounter: Secondary | ICD-10-CM | POA: Diagnosis present

## 2014-10-25 DIAGNOSIS — J189 Pneumonia, unspecified organism: Secondary | ICD-10-CM | POA: Diagnosis present

## 2014-10-25 DIAGNOSIS — L89151 Pressure ulcer of sacral region, stage 1: Secondary | ICD-10-CM | POA: Diagnosis present

## 2014-10-25 DIAGNOSIS — Z8673 Personal history of transient ischemic attack (TIA), and cerebral infarction without residual deficits: Secondary | ICD-10-CM | POA: Diagnosis not present

## 2014-10-25 DIAGNOSIS — L89302 Pressure ulcer of unspecified buttock, stage 2: Secondary | ICD-10-CM | POA: Diagnosis present

## 2014-10-25 DIAGNOSIS — Z85038 Personal history of other malignant neoplasm of large intestine: Secondary | ICD-10-CM | POA: Diagnosis not present

## 2014-10-25 DIAGNOSIS — S72002A Fracture of unspecified part of neck of left femur, initial encounter for closed fracture: Secondary | ICD-10-CM | POA: Diagnosis present

## 2014-10-25 DIAGNOSIS — M25552 Pain in left hip: Secondary | ICD-10-CM | POA: Diagnosis present

## 2014-10-25 DIAGNOSIS — W19XXXA Unspecified fall, initial encounter: Secondary | ICD-10-CM

## 2014-10-25 DIAGNOSIS — I10 Essential (primary) hypertension: Secondary | ICD-10-CM | POA: Diagnosis present

## 2014-10-25 DIAGNOSIS — J449 Chronic obstructive pulmonary disease, unspecified: Secondary | ICD-10-CM | POA: Diagnosis present

## 2014-10-25 LAB — BASIC METABOLIC PANEL
Anion gap: 13 (ref 5–15)
BUN: 17 mg/dL (ref 6–20)
CHLORIDE: 99 mmol/L — AB (ref 101–111)
CO2: 25 mmol/L (ref 22–32)
CREATININE: 0.72 mg/dL (ref 0.61–1.24)
Calcium: 9 mg/dL (ref 8.9–10.3)
GFR calc Af Amer: >60 mL/min (ref >60)
GFR calc non Af Amer: >60 mL/min (ref >60)
GLUCOSE: 99 mg/dL (ref 65–99)
Potassium: 3.8 mmol/L (ref 3.5–5.1)
SODIUM: 137 mmol/L (ref 135–145)

## 2014-10-25 LAB — CBC WITH DIFFERENTIAL/PLATELET
Basophils Absolute: 0 10*3/uL (ref 0.0–0.1)
Basophils Relative: 0 %
EOS ABS: 0 10*3/uL (ref 0.0–0.7)
EOS PCT: 0 %
HCT: 42.7 % (ref 39.0–52.0)
HEMOGLOBIN: 14.3 g/dL (ref 13.0–17.0)
LYMPHS ABS: 1.1 10*3/uL (ref 0.7–4.0)
Lymphocytes Relative: 8 %
MCH: 30.4 pg (ref 26.0–34.0)
MCHC: 33.5 g/dL (ref 30.0–36.0)
MCV: 90.7 fL (ref 78.0–100.0)
MONOS PCT: 6 %
Monocytes Absolute: 0.8 10*3/uL (ref 0.1–1.0)
NEUTROS PCT: 86 %
Neutro Abs: 12.7 10*3/uL — ABNORMAL HIGH (ref 1.7–7.7)
Platelets: 153 10*3/uL (ref 150–400)
RBC: 4.71 MIL/uL (ref 4.22–5.81)
RDW: 13.7 % (ref 11.5–15.5)
WBC: 14.6 10*3/uL — ABNORMAL HIGH (ref 4.0–10.5)

## 2014-10-25 MED ORDER — HYDROCODONE-ACETAMINOPHEN 5-325 MG PO TABS
1.0000 | ORAL_TABLET | Freq: Four times a day (QID) | ORAL | Status: DC | PRN
  Administered 2014-10-26 – 2014-10-27 (×2): 1 via ORAL
  Filled 2014-10-25 (×2): qty 1

## 2014-10-25 MED ORDER — DEXTROSE 5 % IV SOLN: 1.0000 g | Freq: Once | INTRAVENOUS | Status: DC

## 2014-10-25 MED ORDER — IOHEXOL 350 MG/ML SOLN
100.0000 mL | Freq: Once | INTRAVENOUS | Status: AC | PRN
  Administered 2014-10-25: 100 mL via INTRAVENOUS

## 2014-10-25 MED ORDER — ASPIRIN EC 81 MG PO TBEC
81.0000 mg | DELAYED_RELEASE_TABLET | Freq: Every day | ORAL | Status: DC
  Administered 2014-10-26 – 2014-10-27 (×2): 81 mg via ORAL
  Filled 2014-10-25 (×2): qty 1

## 2014-10-25 MED ORDER — IBUPROFEN 200 MG PO TABS: 400.0000 mg | ORAL_TABLET | Freq: Four times a day (QID) | ORAL | Status: DC | PRN

## 2014-10-25 MED ORDER — HYDROMORPHONE HCL 1 MG/ML IJ SOLN
0.5000 mg | Freq: Once | INTRAMUSCULAR | Status: AC
  Administered 2014-10-25: 0.5 mg via INTRAVENOUS
  Filled 2014-10-25: qty 1

## 2014-10-25 MED ORDER — HEPARIN SODIUM (PORCINE) 5000 UNIT/ML IJ SOLN
5000.0000 [IU] | Freq: Three times a day (TID) | INTRAMUSCULAR | Status: DC
  Administered 2014-10-26 – 2014-10-27 (×5): 5000 [IU] via SUBCUTANEOUS
  Filled 2014-10-25 (×5): qty 1

## 2014-10-25 MED ORDER — DEXTROSE 5 % IV SOLN: 500.0000 mg | Freq: Once | INTRAVENOUS | Status: DC

## 2014-10-25 NOTE — ED Notes (Signed)
Left pt wit urinal to try and provide a urine sample

## 2014-10-25 NOTE — ED Notes (Signed)
Pt arrives from home past fall 10/21/14.  EMS reports pt dc'ed from [facility] 10/21/14, reports since then pt has ben unable to walk or bear weight on L leg.  Pt's family reports pt unable to get out of recliner, family has been providing ADL's in recliner.  Swelling noted to L upper anterior thigh, no bruising noted.

## 2014-10-25 NOTE — ED Notes (Signed)
Dr. Fabio Neighbors at bedside at this time.

## 2014-10-25 NOTE — ED Notes (Signed)
Pt to CT at this time.

## 2014-10-25 NOTE — ED Notes (Signed)
Dr Alcario Drought in room and stated to hold off on IV antibiotics at this time due to cxr not showing pneumonia.

## 2014-10-25 NOTE — ED Provider Notes (Signed)
CSN: [id number]     Arrival date & time 10/25/14  1337 History   First MD Initiated Contact with Patient 10/25/14 1458     Chief Complaint  Patient presents with  . Leg Pain  . Cough     (Consider location/radiation/quality/duration/timing/severity/associated sxs/prior Treatment) Patient is a 81 y.o. male presenting with leg pain.  Leg Pain Lower extremity pain location: L femur. Time since incident:  3 days Injury: yes   Mechanism of injury: fall   Fall:    Fall occurred: from standing. Pain details:    Quality:  Aching   Radiates to:  Does not radiate   Severity:  Moderate   Onset quality:  Gradual   Timing:  Constant   Progression:  Unchanged Chronicity:  New Foreign body present:  No foreign bodies Prior injury to area:  No Relieved by:  Nothing Worsened by:  Abduction, bearing weight, flexion and extension Associated symptoms: swelling   Associated symptoms: no back pain, no fatigue, no fever, no itching, no muscle weakness and no numbness     Past Medical History  Diagnosis Date  . Stroke ([place])   . Hip fracture ([place])   . Parkinson's disease ([place])   . COPD (chronic obstructive pulmonary disease) ([place])   . Cancer [facility])     colon cancer  . Hyperlipidemia   . Anxiety   . Depression   . Hypertension    Past Surgical History  Procedure Laterality Date  . Fracture surgery    . Hernia repair    . Eye surgery    . Esophagogastroduodenoscopy  01/01/2012    Procedure: ESOPHAGOGASTRODUODENOSCOPY (EGD);  Surgeon: Winfield Cunas., MD;  Location: [facility] ENDOSCOPY;  Service: Endoscopy;  Laterality: Left;  . Flexible sigmoidoscopy  01/02/2012    Procedure: FLEXIBLE SIGMOIDOSCOPY;  Surgeon: Missy Sabins, MD;  Location: [place];  Service: Endoscopy;  Laterality: N/A;  . Bowel resection  01/05/2012    Procedure: LOW ANTERIOR BOWEL RESECTION;  Surgeon: Edward Jolly, MD;  Location: [facility] OR;  Service: General;  Laterality: N/A;   Family History  Problem Relation  Age of Onset  . Other Neg Hx    Social History  Substance Use Topics  . Smoking status: Former Research scientist (life sciences)  . Smokeless tobacco: None  . Alcohol Use: No    Review of Systems  Constitutional: Negative for fever and fatigue.  Musculoskeletal: Negative for back pain.  Skin: Negative for itching.  All other systems reviewed and are negative.     Allergies  Review of patient's allergies indicates no known allergies.  Home Medications   Prior to Admission medications   Medication Sig Start Date End Date Taking? Authorizing Provider  aspirin EC 81 MG tablet Take 81 mg by mouth daily.   Yes Historical Provider, MD  HYDROcodone-acetaminophen (NORCO/VICODIN) 5-325 MG tablet Take 1 tablet by mouth every 6 (six) hours as needed for moderate pain. 10/22/14  Yes Jola Schmidt, MD  ibuprofen (ADVIL,MOTRIN) 200 MG tablet Take 400 mg by mouth every 6 (six) hours as needed for moderate pain.    Yes Historical Provider, MD  Multiple Vitamin (MULTIVITAMIN WITH MINERALS) TABS Take 1 tablet by mouth daily.   Yes Historical Provider, MD  aspirin EC 325 MG tablet Take 1 tablet (325 mg total) by mouth 2 (two) times daily. 10/26/14   Leighton Parody, PA-C  HYDROcodone-acetaminophen (NORCO) 5-325 MG tablet Take 1 tablet by mouth every 6 (six) hours as needed. 10/26/14   Leighton Parody,  PA-C   BP 132/47 mmHg  Pulse 60  Temp(Src) 98 F (36.7 C) (Oral)  Resp 18  SpO2 97% Physical Exam  Constitutional: He is oriented to person, place, and time. He appears well-developed and well-nourished.  HENT:  Head: Normocephalic and atraumatic.  Eyes: Conjunctivae and EOM are normal.  Neck: Normal range of motion. Neck supple.  Cardiovascular: Normal rate, regular rhythm and normal heart sounds.   Pulmonary/Chest: Effort normal and breath sounds normal. No respiratory distress.  Abdominal: He exhibits no distension. There is no tenderness. There is no rebound and no guarding.  Musculoskeletal: Normal range of motion.        Left hip: He exhibits tenderness.       Left upper leg: He exhibits tenderness and bony tenderness.  Neurological: He is alert and oriented to person, place, and time.  Skin: Skin is warm and dry.  Vitals reviewed.   ED Course  Procedures (including critical care time) Labs Review Labs Reviewed  CBC WITH DIFFERENTIAL/PLATELET - Abnormal; Notable for the following:    WBC 14.6 (*)    Neutro Abs 12.7 (*)    All other components within normal limits  BASIC METABOLIC PANEL - Abnormal; Notable for the following:    Chloride 99 (*)    All other components within normal limits  CBC - Abnormal; Notable for the following:    RBC 4.11 (*)    Hemoglobin 12.7 (*)    HCT 37.0 (*)    Platelets 145 (*)    All other components within normal limits  CULTURE, BLOOD (ROUTINE X 2)  CULTURE, BLOOD (ROUTINE X 2)  CULTURE, BLOOD (ROUTINE X 2)  CULTURE, BLOOD (ROUTINE X 2)  CULTURE, EXPECTORATED SPUTUM-ASSESSMENT  GRAM STAIN  HIV ANTIBODY (ROUTINE TESTING)  URINALYSIS, ROUTINE W REFLEX MICROSCOPIC (NOT AT [facility])  LEGIONELLA PNEUMOPHILA SEROGP 1 UR AG  STREP PNEUMONIAE URINARY ANTIGEN    Imaging Review Dg Chest 2 View  10/25/2014   CLINICAL DATA:  Cough  EXAM: CHEST - 2 VIEW  COMPARISON:  01/03/2012  FINDINGS: Cardiac shadow is stable. The lungs are well aerated bilaterally. Left basilar atelectasis/scarring is seen. Bony structures are within normal limits. Aortic calcifications are again noted.  IMPRESSION: Changes in the left base which may represent scarring although acute atelectasis would deserve consideration as well. No focal confluent infiltrate is seen.   Electronically Signed   By: Inez Catalina M.D.   On: 10/25/2014 15:51   Dg Pelvis 1-2 Views  10/25/2014   CLINICAL DATA:  Left leg pain.  Fall.  EXAM: PELVIS - 1-2 VIEW  COMPARISON:  11/30/2008  FINDINGS: Left hip hemiarthroplasty is anatomically aligned. No breakage or loosening of the hardware. Dynamic compression screw and  sideplate in the proximal right femur is stable. Osteopenia. No fracture.  IMPRESSION: No acute bony pathology.   Electronically Signed   By: Marybelle Killings M.D.   On: 10/25/2014 18:14   Ct Angio Chest Pe W/cm &/or Wo Cm  10/25/2014   CLINICAL DATA:  Fall.  Hypoxia.  EXAM: CT ANGIOGRAPHY CHEST WITH CONTRAST  TECHNIQUE: Multidetector CT imaging of the chest was performed using the standard protocol during bolus administration of intravenous contrast. Multiplanar CT image reconstructions and MIPs were obtained to evaluate the vascular anatomy.  CONTRAST:  161mL OMNIPAQUE IOHEXOL 350 MG/ML SOLN  COMPARISON:  01/02/2012  FINDINGS: There are no filling defects in the pulmonary arterial tree to suggest acute pulmonary thromboembolism.  No evidence of aortic dissection. Atherosclerotic changes involving  the aortic arch are noted.  Small scattered mediastinal nodes. Mildly enlarged precarinal lymph node is stable.  No pericardial effusion.  No pleural effusion.  No pneumothorax.  Minimal patchy consolidation in the dependent right lower lobe. There is more confluent consolidation in the dependent and basilar left lower lobe. There is also consolidation in the lingula.  Cholelithiasis.  No acute bony deformity.  Review of the MIP images confirms the above findings.  IMPRESSION: No evidence of acute pulmonary thromboembolism.  There is consolidation in the right lower lobe, left lower lobe, and lingula worrisome for pneumonia or aspiration.  Stable mild mediastinal adenopathy.  Cholelithiasis.   Electronically Signed   By: Marybelle Killings M.D.   On: 10/25/2014 21:58   Ct Hip Left Wo Contrast  10/25/2014   CLINICAL DATA:  [place], striking left side of the body.  EXAM: CT OF THE LEFT HIP WITHOUT CONTRAST  TECHNIQUE: Multidetector CT imaging of the left hip was performed according to the standard protocol. Multiplanar CT image reconstructions were also generated.  COMPARISON:  Radiographs 10/25/2014.  CT 12/27/2008.  FINDINGS:  There is irregularity and cortical disruption across the greater trochanter consistent with an acute fracture. This is nondisplaced. The hip prosthesis is intact. Acetabulum and pubic rami are intact. No significant hematoma.  IMPRESSION: Nondisplaced greater trochanteric fracture.   Electronically Signed   By: Andreas Newport M.D.   On: 10/25/2014 20:26   Dg Femur Min 2 Views Left  10/25/2014   CLINICAL DATA:  Status post fall.  Lateral proximal femur pain  EXAM: LEFT FEMUR 2 VIEWS  COMPARISON:  None.  FINDINGS: There is a left hip arthroplasty. There is no fracture or dislocation. There is no hardware failure or complication. There is generalized osteopenia. There is no aggressive lytic or sclerotic osseous lesion.  IMPRESSION: No acute osseous injury of the left femur.   Electronically Signed   By: Kathreen Devoid   On: 10/25/2014 18:13   I have personally reviewed and evaluated these images and lab results as part of my medical decision-making.   EKG Interpretation None      MDM   Final diagnoses:  Fall  Hip pain  Closed left hip fracture, initial encounter [facility])    81 y.o. male with pertinent PMH of prior CVA, parkinson's, COPD, colon ca, prior hip fracture presents with continued L hip pain and inability to ambulate x 3 days.  Seen at [facility] with negative XR.  Also complaining of cough, here hypoxic to 88% on room air.  Wu obtained as above with hip fx.  Admitted in stable condition  I have reviewed all laboratory and imaging studies if ordered as above  1. Closed left hip fracture, initial encounter ([place])   2. Fall   3. Hip pain         Debby Freiberg, MD 10/27/14 [phone]

## 2014-10-25 NOTE — H&P (Signed)
[facility] History and Physical  Cole Cunningham WIO:[id number] DOB: January 23, 1932 DOA: 10/25/2014  Referring physician: EDP PCP: Antony Blackbird, MD   Chief Complaint: Left hip pain   HPI: Cole Cunningham is a 81 y.o. male h/o CVA, non-verbal since then, normally ambulates with walker at baseline, who suffered a mechanical fall 3 days ago.  He landed on his L hip.  Had pain and inability to ambulate since the fall.  Was taken to [facility] ED where X ray was negative for acute fracture.  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  He was sent home but since that time has been unable to ambulate.  Any movement of the L hip makes pain worse.  Pain is severe with movement.  While in ED he was noted to be desating to 88% on RA per EDP (92% on my exam).  Normally has normal lungs.  Patient indicates he is asymptomatic from pulmonary standpoint.  Review of Systems: Systems reviewed.  As above, otherwise negative  Past Medical History  Diagnosis Date  . Stroke ([place])   . Hip fracture ([place])   . Parkinson's disease ([place])   . COPD (chronic obstructive pulmonary disease) ([place])   . Cancer [facility])     colon cancer  . Hyperlipidemia   . Anxiety   . Depression   . Hypertension    Past Surgical History  Procedure Laterality Date  . Fracture surgery    . Hernia repair    . Eye surgery    . Esophagogastroduodenoscopy  01/01/2012    Procedure: ESOPHAGOGASTRODUODENOSCOPY (EGD);  Surgeon: Winfield Cunas., MD;  Location: [facility] ENDOSCOPY;  Service: Endoscopy;  Laterality: Left;  . Flexible sigmoidoscopy  01/02/2012    Procedure: FLEXIBLE SIGMOIDOSCOPY;  Surgeon: Missy Sabins, MD;  Location: [place];  Service: Endoscopy;  Laterality: N/A;  . Bowel resection  01/05/2012    Procedure: LOW ANTERIOR BOWEL RESECTION;  Surgeon: Edward Jolly, MD;  Location: [place];  Service: General;  Laterality: N/A;   Social History:  reports that he has quit  smoking. He does not have any smokeless tobacco history on file. He reports that he does not drink alcohol or use illicit drugs.  No Known Allergies  Family History  Problem Relation Age of Onset  . Other Neg Hx      Prior to Admission medications   Medication Sig Start Date End Date Taking? Authorizing Provider  aspirin EC 81 MG tablet Take 81 mg by mouth daily.   Yes Historical Provider, MD  HYDROcodone-acetaminophen (NORCO/VICODIN) 5-325 MG tablet Take 1 tablet by mouth every 6 (six) hours as needed for moderate pain. 10/22/14  Yes Jola Schmidt, MD  ibuprofen (ADVIL,MOTRIN) 200 MG tablet Take 400 mg by mouth every 6 (six) hours as needed for moderate pain.    Yes Historical Provider, MD  Multiple Vitamin (MULTIVITAMIN WITH MINERALS) TABS Take 1 tablet by mouth daily.   Yes Historical Provider, MD   Physical Exam: Filed Vitals:   10/25/14 1930  BP: 144/56  Pulse: 79  Temp:   Resp: 26    BP 144/56 mmHg  Pulse 79  Temp(Src) 98.3 F (36.8 C) (Oral)  Resp 26  SpO2 93%  General Appearance:    Alert, non-verbal but able to answer yes/no to questions by nodding head and answers seem appropriate, no distress, appears stated age  Head:    Normocephalic, atraumatic  Eyes:    PERRL,  EOMI, sclera non-icteric        Nose:   Nares without drainage or epistaxis. Mucosa, turbinates normal  Throat:   Moist mucous membranes. Oropharynx without erythema or exudate.  Neck:   Supple. No carotid bruits.  No thyromegaly.  No lymphadenopathy.   Back:     No CVA tenderness, no spinal tenderness  Lungs:     Clear to auscultation bilaterally, without wheezes, rhonchi or rales  Chest wall:    No tenderness to palpitation  Heart:    Regular rate and rhythm without murmurs, gallops, rubs  Abdomen:     Soft, non-tender, nondistended, normal bowel sounds, no organomegaly  Genitalia:    deferred  Rectal:    deferred  Extremities:   No clubbing, cyanosis or edema.  L hip is TTP, pain with movement, no  pain at all of R hip.  Pulses:   2+ and symmetric all extremities  Skin:   Skin color, texture, turgor normal, no rashes or lesions  Lymph nodes:   Cervical, supraclavicular, and axillary nodes normal  Neurologic:   CNII-XII intact. Normal strength, sensation and reflexes      throughout    Labs on Admission:  Basic Metabolic Panel:  Recent Labs Lab 10/25/14 1655  NA 137  K 3.8  CL 99*  CO2 25  GLUCOSE 99  BUN 17  CREATININE 0.72  CALCIUM 9.0   Liver Function Tests: No results for input(s): AST, ALT, ALKPHOS, BILITOT, PROT, ALBUMIN in the last 168 hours. No results for input(s): LIPASE, AMYLASE in the last 168 hours. No results for input(s): AMMONIA in the last 168 hours. CBC:  Recent Labs Lab 10/25/14 1655  WBC 14.6*  NEUTROABS 12.7*  HGB 14.3  HCT 42.7  MCV 90.7  PLT 153   Cardiac Enzymes: No results for input(s): CKTOTAL, CKMB, CKMBINDEX, TROPONINI in the last 168 hours.  BNP (last 3 results) No results for input(s): PROBNP in the last 8760 hours. CBG: No results for input(s): GLUCAP in the last 168 hours.  Radiological Exams on Admission: Dg Chest 2 View  10/25/2014   CLINICAL DATA:  Cough  EXAM: CHEST - 2 VIEW  COMPARISON:  01/03/2012  FINDINGS: Cardiac shadow is stable. The lungs are well aerated bilaterally. Left basilar atelectasis/scarring is seen. Bony structures are within normal limits. Aortic calcifications are again noted.  IMPRESSION: Changes in the left base which may represent scarring although acute atelectasis would deserve consideration as well. No focal confluent infiltrate is seen.   Electronically Signed   By: Inez Catalina M.D.   On: 10/25/2014 15:51   Dg Pelvis 1-2 Views  10/25/2014   CLINICAL DATA:  Left leg pain.  Fall.  EXAM: PELVIS - 1-2 VIEW  COMPARISON:  11/30/2008  FINDINGS: Left hip hemiarthroplasty is anatomically aligned. No breakage or loosening of the hardware. Dynamic compression screw and sideplate in the proximal right femur  is stable. Osteopenia. No fracture.  IMPRESSION: No acute bony pathology.   Electronically Signed   By: Marybelle Killings M.D.   On: 10/25/2014 18:14   Dg Femur Min 2 Views Left  10/25/2014   CLINICAL DATA:  Status post fall.  Lateral proximal femur pain  EXAM: LEFT FEMUR 2 VIEWS  COMPARISON:  None.  FINDINGS: There is a left hip arthroplasty. There is no fracture or dislocation. There is no hardware failure or complication. There is generalized osteopenia. There is no aggressive lytic or sclerotic osseous lesion.  IMPRESSION: No acute osseous injury of the  left femur.   Electronically Signed   By: Kathreen Devoid   On: 10/25/2014 18:13    EKG: Independently reviewed.  Assessment/Plan Principal Problem:   Left hip pain Active Problems:   Hypoxia   1. L hip pain - 1. CT scan of left hip is ordered to look for occult fracture 2. CM and SW involved already 2. Hypoxia - 1. Question of hypoxia but patient has no h/o fever, cough, SOB, etc to suggest PNA. 2. EDP has ordered CTA chest    Code Status: Full  Family Communication: Daughter at bedside Disposition Plan: Admit to obs   Time spent: 70 min  GARDNER, JARED M. [facility] Pager [phone]  If 7AM-7PM, please contact the day team taking care of the patient [email or web address] Password [email or web address] 10/25/2014, 7:45 PM

## 2014-10-25 NOTE — ED Notes (Signed)
Apolonio Schneiders, RN on 5N updated on pt.

## 2014-10-26 DIAGNOSIS — S72002A Fracture of unspecified part of neck of left femur, initial encounter for closed fracture: Secondary | ICD-10-CM

## 2014-10-26 DIAGNOSIS — J189 Pneumonia, unspecified organism: Principal | ICD-10-CM

## 2014-10-26 DIAGNOSIS — M25552 Pain in left hip: Secondary | ICD-10-CM

## 2014-10-26 DIAGNOSIS — Z8673 Personal history of transient ischemic attack (TIA), and cerebral infarction without residual deficits: Secondary | ICD-10-CM

## 2014-10-26 DIAGNOSIS — R0902 Hypoxemia: Secondary | ICD-10-CM

## 2014-10-26 DIAGNOSIS — L8990 Pressure ulcer of unspecified site, unspecified stage: Secondary | ICD-10-CM | POA: Insufficient documentation

## 2014-10-26 MED ORDER — DEXTROSE 5 % IV SOLN
500.0000 mg | INTRAVENOUS | Status: DC
  Administered 2014-10-26 – 2014-10-27 (×2): 500 mg via INTRAVENOUS
  Filled 2014-10-26 (×2): qty 500

## 2014-10-26 MED ORDER — ENSURE ENLIVE PO LIQD
237.0000 mL | Freq: Two times a day (BID) | ORAL | Status: DC
  Administered 2014-10-26 – 2014-10-27 (×2): 237 mL via ORAL

## 2014-10-26 MED ORDER — HYDROCODONE-ACETAMINOPHEN 5-325 MG PO TABS: 1.0000 | ORAL_TABLET | Freq: Four times a day (QID) | ORAL | Status: DC | PRN

## 2014-10-26 MED ORDER — DEXTROSE 5 % IV SOLN
1.0000 g | INTRAVENOUS | Status: DC
  Administered 2014-10-26 – 2014-10-27 (×2): 1 g via INTRAVENOUS
  Filled 2014-10-26 (×2): qty 10

## 2014-10-26 MED ORDER — ASPIRIN EC 325 MG PO TBEC: 325.0000 mg | DELAYED_RELEASE_TABLET | Freq: Two times a day (BID) | ORAL | Status: DC

## 2014-10-26 NOTE — Progress Notes (Addendum)
[facility]                                                                              Patient Demographics  Cole Cunningham, is a 81 y.o. male, DOB - 24-Oct-1932, RCB:[id number]  Admit date - 10/25/2014   Admitting Physician Etta Quill, DO  Outpatient Primary MD for the patient is [name], [name], MD  LOS - 1   Chief Complaint  Patient presents with  . Leg Pain  . Cough      HPI on 10/25/2014 by Dr. [name] is a 81 y.o. male h/o CVA, non-verbal since then, normally ambulates with walker at baseline, who suffered a mechanical fall 3 days ago. He landed on his L hip. Had pain and inability to ambulate since the fall. Was taken to [facility] ED where X ray was negative for acute fracture. 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 He was sent home but since that time has been unable to ambulate. Any movement of the L hip makes pain worse. Pain is severe with movement. While in ED he was noted to be desating to 88% on RA per EDP (92% on my exam). Normally has normal lungs. Patient indicates he is asymptomatic from pulmonary standpoint.  Assessment & Plan   Nondisplaced Left trochanteric fracture -Seen on CT  -Orthopedic surgery consulted and appreciated, patient currently does not need surgical intervention, will follow up in the clinic 10-14 days.  Weightbearing as tolerated with a walker -PT and OT consulted and pending -Patient will likely need nursing facility placement  Community-acquired pneumonia -CTA chest was negative for pulmonary embolism although there was noted right lower lobe and left lower lobe consolidation -Will order pneumonia workup, sputum culture and Gram stain, urine strep pneumonia and legionella antigens, blood cultures, azithromycin and ceftriaxone  Hypoxia -A secondary to the above -Patient was noted to have 88% oxygen saturation on room air -Continue supplemental oxygen  to maintain above 92%  History of CVA with aphasia -Continue aspirin -Currently on no statin  History of colon cancer  Leukocytosis -possibly secondary to hip fracture vs pna -Continue to monitor CBC  Code Status: Full  Family Communication: None at bedside. Wife via phone  Disposition Plan: Admitted  Time Spent in minutes   30 minutes  Procedures  None  Consults   Orthopedic surgery  DVT Prophylaxis  heparin  Lab Results  Component Value Date   PLT 153 10/25/2014    Medications  Scheduled Meds: . aspirin EC  81 mg Oral Daily  . azithromycin  500 mg Intravenous Q24H  . cefTRIAXone (ROCEPHIN)  IV  1 g Intravenous Q24H  . heparin  5,000 Units Subcutaneous 3 times per day   Continuous Infusions:  PRN Meds:.HYDROcodone-acetaminophen, ibuprofen  Antibiotics    Anti-infectives    Start     Dose/Rate Route Frequency Ordered Stop   10/26/14 0800  cefTRIAXone (ROCEPHIN) 1 g in dextrose 5 % 50 mL IVPB     1 g 100 mL/hr over 30 Minutes Intravenous Every 24 hours 10/26/14 0658 11/02/14 0759   10/26/14 0800  azithromycin (ZITHROMAX) 500 mg in  dextrose 5 % 250 mL IVPB     500 mg 250 mL/hr over 60 Minutes Intravenous Every 24 hours 10/26/14 0658 11/02/14 0759   10/25/14 1900  cefTRIAXone (ROCEPHIN) 1 g in dextrose 5 % 50 mL IVPB  Status:  Discontinued     1 g 100 mL/hr over 30 Minutes Intravenous  Once 10/25/14 1856 10/25/14 1929   10/25/14 1900  azithromycin (ZITHROMAX) 500 mg in dextrose 5 % 250 mL IVPB  Status:  Discontinued     500 mg 250 mL/hr over 60 Minutes Intravenous  Once 10/25/14 1856 10/25/14 1929       Subjective:   Nathanael Gerst seen and examined today.  Although patient is nonverbal due to stroke, he is able to communicate.  Denies shortness of breath, chest pain, abdominal pain.  Has pain in his left leg.     Objective:   Filed Vitals:   10/25/14 2045 10/25/14 2100 10/25/14 2320 10/26/14 0600  BP: 130/41 130/43 145/54 147/54  Pulse: 74  76 71 74  Temp:   98.3 F (36.8 C) 99 F (37.2 C)  TempSrc:      Resp: 22 21 20 20   SpO2: 93% 91% 92% 97%    Wt Readings from Last 3 Encounters:  10/22/14 63.504 kg (140 lb)  03/11/12 62.596 kg (138 lb)  01/21/12 63.504 kg (140 lb)     Intake/Output Summary (Last 24 hours) at 10/26/14 1223 Last data filed at 10/25/14 2330  Gross per 24 hour  Intake    120 ml  Output      0 ml  Net    120 ml    Exam  General: Well developed, thin, NAD, appears stated age  HEENT: NCAT,  mucous membranes moist.   Cardiovascular: S1 S2 auscultated, no rubs, murmurs or gallops. Regular rate and rhythm.  Respiratory: Clear to auscultation bilaterally with equal chest rise  Abdomen: Soft, nontender, nondistended, + bowel sounds  Extremities: warm dry without cyanosis clubbing or edema.  Left hip TTP  Neuro: AAOx3, nonfocal  Data Review   Micro Results Recent Results (from the past 240 hour(s))  Blood culture (routine x 2)     Status: None (Preliminary result)   Collection Time: 10/25/14  8:45 PM  Result Value Ref Range Status   Specimen Description BLOOD RIGHT HAND  Final   Special Requests BOTTLES DRAWN AEROBIC AND ANAEROBIC 5CC  Final   Culture NO GROWTH < 24 HOURS  Final   Report Status PENDING  Incomplete  Blood culture (routine x 2)     Status: None (Preliminary result)   Collection Time: 10/25/14  8:57 PM  Result Value Ref Range Status   Specimen Description BLOOD RIGHT FOREARM  Final   Special Requests BOTTLES DRAWN AEROBIC AND ANAEROBIC 5CC  Final   Culture NO GROWTH < 24 HOURS  Final   Report Status PENDING  Incomplete    Radiology Reports Dg Chest 2 View  10/25/2014   CLINICAL DATA:  Cough  EXAM: CHEST - 2 VIEW  COMPARISON:  01/03/2012  FINDINGS: Cardiac shadow is stable. The lungs are well aerated bilaterally. Left basilar atelectasis/scarring is seen. Bony structures are within normal limits. Aortic calcifications are again noted.  IMPRESSION: Changes in the left  base which may represent scarring although acute atelectasis would deserve consideration as well. No focal confluent infiltrate is seen.   Electronically Signed   By: Inez Catalina M.D.   On: 10/25/2014 15:51   Dg Pelvis 1-2 Views  10/25/2014   CLINICAL DATA:  Left leg pain.  Fall.  EXAM: PELVIS - 1-2 VIEW  COMPARISON:  11/30/2008  FINDINGS: Left hip hemiarthroplasty is anatomically aligned. No breakage or loosening of the hardware. Dynamic compression screw and sideplate in the proximal right femur is stable. Osteopenia. No fracture.  IMPRESSION: No acute bony pathology.   Electronically Signed   By: Marybelle Killings M.D.   On: 10/25/2014 18:14   Ct Angio Chest Pe W/cm &/or Wo Cm  10/25/2014   CLINICAL DATA:  Fall.  Hypoxia.  EXAM: CT ANGIOGRAPHY CHEST WITH CONTRAST  TECHNIQUE: Multidetector CT imaging of the chest was performed using the standard protocol during bolus administration of intravenous contrast. Multiplanar CT image reconstructions and MIPs were obtained to evaluate the vascular anatomy.  CONTRAST:  112mL OMNIPAQUE IOHEXOL 350 MG/ML SOLN  COMPARISON:  01/02/2012  FINDINGS: There are no filling defects in the pulmonary arterial tree to suggest acute pulmonary thromboembolism.  No evidence of aortic dissection. Atherosclerotic changes involving the aortic arch are noted.  Small scattered mediastinal nodes. Mildly enlarged precarinal lymph node is stable.  No pericardial effusion.  No pleural effusion.  No pneumothorax.  Minimal patchy consolidation in the dependent right lower lobe. There is more confluent consolidation in the dependent and basilar left lower lobe. There is also consolidation in the lingula.  Cholelithiasis.  No acute bony deformity.  Review of the MIP images confirms the above findings.  IMPRESSION: No evidence of acute pulmonary thromboembolism.  There is consolidation in the right lower lobe, left lower lobe, and lingula worrisome for pneumonia or aspiration.  Stable mild mediastinal  adenopathy.  Cholelithiasis.   Electronically Signed   By: Marybelle Killings M.D.   On: 10/25/2014 21:58   Ct Hip Left Wo Contrast  10/25/2014   CLINICAL DATA:  [place], striking left side of the body.  EXAM: CT OF THE LEFT HIP WITHOUT CONTRAST  TECHNIQUE: Multidetector CT imaging of the left hip was performed according to the standard protocol. Multiplanar CT image reconstructions were also generated.  COMPARISON:  Radiographs 10/25/2014.  CT 12/27/2008.  FINDINGS: There is irregularity and cortical disruption across the greater trochanter consistent with an acute fracture. This is nondisplaced. The hip prosthesis is intact. Acetabulum and pubic rami are intact. No significant hematoma.  IMPRESSION: Nondisplaced greater trochanteric fracture.   Electronically Signed   By: Andreas Newport M.D.   On: 10/25/2014 20:26   Dg Hip Unilat With Pelvis 2-3 Views Left  10/22/2014   CLINICAL DATA:  Left hip pain after falling at home last night.  EXAM: DG HIP (WITH OR WITHOUT PELVIS) 2-3V LEFT  COMPARISON:  CT scan 01/02/2012  FINDINGS: There is no acute fracture or dislocation. The left proximal femoral prosthesis is properly located. Dystrophic calcifications in the soft tissues extending from the lesser trochanter and from the posterior superior aspect of the left acetabulum are unchanged since 2013. Pelvic bones are intact. Compression screw and sideplate in the proximal right femur.  IMPRESSION: No acute abnormalities.   Electronically Signed   By: Lorriane Shire M.D.   On: 10/22/2014 08:10   Dg Femur Min 2 Views Left  10/25/2014   CLINICAL DATA:  Status post fall.  Lateral proximal femur pain  EXAM: LEFT FEMUR 2 VIEWS  COMPARISON:  None.  FINDINGS: There is a left hip arthroplasty. There is no fracture or dislocation. There is no hardware failure or complication. There is generalized osteopenia. There is no aggressive lytic or sclerotic osseous lesion.  IMPRESSION: No acute osseous injury of the left femur.    Electronically Signed   By: Kathreen Devoid   On: 10/25/2014 18:13    CBC  Recent Labs Lab 10/25/14 1655  WBC 14.6*  HGB 14.3  HCT 42.7  PLT 153  MCV 90.7  MCH 30.4  MCHC 33.5  RDW 13.7  LYMPHSABS 1.1  MONOABS 0.8  EOSABS 0.0  BASOSABS 0.0    Chemistries   Recent Labs Lab 10/25/14 1655  NA 137  K 3.8  CL 99*  CO2 25  GLUCOSE 99  BUN 17  CREATININE 0.72  CALCIUM 9.0   ------------------------------------------------------------------------------------------------------------------ estimated creatinine clearance is 61.7 mL/min (by C-G formula based on Cr of 0.72). ------------------------------------------------------------------------------------------------------------------ No results for input(s): HGBA1C in the last 72 hours. ------------------------------------------------------------------------------------------------------------------ No results for input(s): CHOL, HDL, LDLCALC, TRIG, CHOLHDL, LDLDIRECT in the last 72 hours. ------------------------------------------------------------------------------------------------------------------ No results for input(s): TSH, T4TOTAL, T3FREE, THYROIDAB in the last 72 hours.  Invalid input(s): FREET3 ------------------------------------------------------------------------------------------------------------------ No results for input(s): VITAMINB12, FOLATE, FERRITIN, TIBC, IRON, RETICCTPCT in the last 72 hours.  Coagulation profile No results for input(s): INR, PROTIME in the last 168 hours.  No results for input(s): DDIMER in the last 72 hours.  Cardiac Enzymes No results for input(s): CKMB, TROPONINI, MYOGLOBIN in the last 168 hours.  Invalid input(s): CK ------------------------------------------------------------------------------------------------------------------ Invalid input(s): POCBNP    Sherice Ijames D.O. on 10/26/2014 at 12:23 PM  Between 7am to 7pm - Pager - [phone]  After 7pm go to  [email or web address] - password [email or web address]  And look for the night coverage person covering for me after hours  [facility] Office  [phone]

## 2014-10-26 NOTE — Evaluation (Signed)
Physical Therapy Evaluation Patient Details Name: Cole Cunningham MRN: [id number] DOB: July 30, 1932 Today's Date: 10/26/2014   History of Present Illness  Pt is an 81 y/o M s/ fall w/ resultant nondisplaced Lt trochanteric fx, currently not needing surgical intervention.  Pt's PMH includes Bil hip fx's, Lt hip hemiarthroplasty, stroke, Parkinson's, anxiety and depression, COPD, colon cancer.  Clinical Impression  Pt admitted with above diagnosis. Pt currently with functional limitations due to the deficits listed below (see PT Problem List). Cole Cunningham required total +2 assist for bed mobility and sit<>stand transfers. Max +2 assist for stand pivot. Due to expressive language deficits, unsure of accuracy of history or home environment information gathered.  Pt will benefit from skilled PT to increase their independence and safety with mobility to allow discharge to the venue listed below.      Follow Up Recommendations SNF;Supervision/Assistance - 24 hour    Equipment Recommendations  Other (comment) (TBD at next venue of care)    Recommendations for Other Services       Precautions / Restrictions Precautions Precautions: Fall Restrictions Weight Bearing Restrictions: Yes LLE Weight Bearing: Weight bearing as tolerated      Mobility  Bed Mobility Overal bed mobility: Needs Assistance;+2 for physical assistance Bed Mobility: Supine to Sit     Supine to sit: Total assist;+2 for physical assistance;HOB elevated     General bed mobility comments: Pt able to manage legs to EOB w/ min assist. Required total assist x 2 to shift hips to EOB with bed pad. Support for Bil LEs required to bring LEs off of bed to rest on ground. Total assist to scoot forward to EOB  Transfers Overall transfer level: Needs assistance Equipment used: Rolling walker (2 wheeled) Transfers: Sit to/from [facility] Sit to Stand: Total assist;+2 physical assistance Stand pivot  transfers: Max assist;+2 physical assistance       General transfer comment: Total assist to power up to standing using bed pad.  Cues for hand placement and to stand upright; pt w/ fixed kyphotic posture.  Max +2 assist managing RW and maintaining pt's balance during stand pivot.  Ambulation/Gait                Stairs            Wheelchair Mobility    Modified Rankin (Stroke Patients Only)       Balance Overall balance assessment: Needs assistance Sitting-balance support: Feet supported;Bilateral upper extremity supported Sitting balance-Leahy Scale: Fair     Standing balance support: Bilateral upper extremity supported;During functional activity Standing balance-Leahy Scale: Zero Standing balance comment: Max assist to maintain upright 2/2 pain in Lt hip and pt's kyphotic posture                             Pertinent Vitals/Pain Pain Assessment: 0-10 Pain Score: 8  Pain Location: Lt hip Pain Descriptors / Indicators: Grimacing;Guarding;Sore Pain Intervention(s): Limited activity within patient's tolerance;Monitored during session;Repositioned    Home Living Family/patient expects to be discharged to:: Skilled nursing facility Living Arrangements: Spouse/significant other Available Help at Discharge: Family;Available 24 hours/day (wife) Type of Home: House Home Access: Level entry     Home Layout: One level Home Equipment: Walker - 2 wheels;Cane - single point;Shower seat Additional Comments: Pt reports home information presented above, unsure of accuracy of information. No family present to confirm    Prior Function Level of Independence: Needs assistance   Gait /  Transfers Assistance Needed: Pt reports PTA he was Ind ambulating w/ RW  ADL's / Homemaking Assistance Needed: Pt reported that he was independent with ADLs PTA. After further questioning, pt reported that his wife helps with bathing. Unsure of prior level of function with ADLs  and functional mobility.        Hand Dominance   Dominant Hand: Right    Extremity/Trunk Assessment   Upper Extremity Assessment: Defer to OT evaluation           Lower Extremity Assessment: Generalized weakness;LLE deficits/detail   LLE Deficits / Details: muscle guarding and weakness/limited ROM 2/2 nondisplaced fx  Cervical / Trunk Assessment: Kyphotic (very kyphotic)  Communication   Communication: Expressive difficulties (Able to respond although difficulty to hear)  Cognition Arousal/Alertness: Awake/alert Behavior During Therapy: WFL for tasks assessed/performed Overall Cognitive Status: Within Functional Limits for tasks assessed                      General Comments General comments (skin integrity, edema, etc.): Due to expressive language deficits, unsure of accuracy of history or home environment information gathered.  Charge nurse made aware that if pt pushes call bell he is unable to verbalize what he needs and a nurse tech or RN should go check on him; pt agreeable to this plan as well.    Exercises General Exercises - Lower Extremity Ankle Circles/Pumps: AROM;Both;10 reps;Seated Quad Sets: Strengthening;Both;10 reps;Seated Gluteal Sets: Strengthening;Both;10 reps;Seated      Assessment/Plan    PT Assessment Patient needs continued PT services  PT Diagnosis Difficulty walking;Abnormality of gait;Generalized weakness;Acute pain   PT Problem List Decreased strength;Decreased range of motion;Decreased activity tolerance;Decreased balance;Decreased mobility;Decreased coordination;Decreased cognition;Decreased knowledge of use of DME;Decreased safety awareness;Decreased knowledge of precautions;Decreased skin integrity;Pain  PT Treatment Interventions DME instruction;Gait training;Functional mobility training;Therapeutic activities;Therapeutic exercise;Balance training;Neuromuscular re-education;Cognitive remediation;Patient/family education;Modalities    PT Goals (Current goals can be found in the Care Plan section) Acute Rehab PT Goals Patient Stated Goal: none stated PT Goal Formulation: With patient Time For Goal Achievement: 11/09/14 Potential to Achieve Goals: Fair    Frequency Min 3X/week   Barriers to discharge   unsure of pt's barriers w/ information provided    Co-evaluation   Reason for Co-Treatment: Complexity of the patient's impairments (multi-system involvement);For patient/therapist safety PT goals addressed during session: Mobility/safety with mobility;Balance;Proper use of DME;Strengthening/ROM OT goals addressed during session: ADL's and self-care       End of Session Equipment Utilized During Treatment: Gait belt;Oxygen Activity Tolerance: Patient limited by pain;Patient limited by fatigue Patient left: in chair;with call bell/phone within reach Nurse Communication: Mobility status;Patient requests pain meds;Precautions;Weight bearing status         Time: 6803-2122 PT Time Calculation (min) (ACUTE ONLY): 29 min   Charges:   PT Evaluation $Initial PT Evaluation Tier I: 1 Procedure     PT G Codes:       Joslyn Hy PT, DPT [phone] Pager: [phone] 10/26/2014, 5:19 PM

## 2014-10-26 NOTE — Consult Note (Signed)
Reason for Consult: Left HIp Pain  Referring Physician: [facility]  Cole Cunningham is an 81 y.o. male.   HPI: 81 y.o. male with pertinent PMH of prior CVA, parkinson's, COPD, colon ca, prior hip fracture presents with continued L hip pain and inability to ambulate x 3 days. Pt also has aphasia from a previous stroke.  Seen at [facility] with negative xrays.  A CT was ordered and did show a non displaced left greater trochanteric fracture.  [facility] -  Dr. Mayer Camel was consulted as he was on [employer].  Past Medical History  Diagnosis Date  . Stroke ([place])   . Hip fracture ([place])   . Parkinson's disease ([place])   . COPD (chronic obstructive pulmonary disease) ([place])   . Cancer [facility])     colon cancer  . Hyperlipidemia   . Anxiety   . Depression   . Hypertension     Past Surgical History  Procedure Laterality Date  . Fracture surgery    . Hernia repair    . Eye surgery    . Esophagogastroduodenoscopy  01/01/2012    Procedure: ESOPHAGOGASTRODUODENOSCOPY (EGD);  Surgeon: Winfield Cunas., MD;  Location: [facility] ENDOSCOPY;  Service: Endoscopy;  Laterality: Left;  . Flexible sigmoidoscopy  01/02/2012    Procedure: FLEXIBLE SIGMOIDOSCOPY;  Surgeon: Missy Sabins, MD;  Location: [place];  Service: Endoscopy;  Laterality: N/A;  . Bowel resection  01/05/2012    Procedure: LOW ANTERIOR BOWEL RESECTION;  Surgeon: Edward Jolly, MD;  Location: [facility] OR;  Service: General;  Laterality: N/A;    Family History  Problem Relation Age of Onset  . Other Neg Hx     Social History:  reports that he has quit smoking. He does not have any smokeless tobacco history on file. He reports that he does not drink alcohol or use illicit drugs.  Allergies: No Known Allergies  Medications: I have reviewed the patient's current medications.  Results for orders placed or performed during the hospital encounter of 10/25/14 (from the past 48 hour(s))  CBC with Differential     Status:  Abnormal   Collection Time: 10/25/14  4:55 PM  Result Value Ref Range   WBC 14.6 (H) 4.0 - 10.5 K/uL   RBC 4.71 4.22 - 5.81 MIL/uL   Hemoglobin 14.3 13.0 - 17.0 g/dL   HCT 42.7 39.0 - 52.0 %   MCV 90.7 78.0 - 100.0 fL   MCH 30.4 26.0 - 34.0 pg   MCHC 33.5 30.0 - 36.0 g/dL   RDW 13.7 11.5 - 15.5 %   Platelets 153 150 - 400 K/uL   Neutrophils Relative % 86 %   Neutro Abs 12.7 (H) 1.7 - 7.7 K/uL   Lymphocytes Relative 8 %   Lymphs Abs 1.1 0.7 - 4.0 K/uL   Monocytes Relative 6 %   Monocytes Absolute 0.8 0.1 - 1.0 K/uL   Eosinophils Relative 0 %   Eosinophils Absolute 0.0 0.0 - 0.7 K/uL   Basophils Relative 0 %   Basophils Absolute 0.0 0.0 - 0.1 K/uL  Basic metabolic panel     Status: Abnormal   Collection Time: 10/25/14  4:55 PM  Result Value Ref Range   Sodium 137 135 - 145 mmol/L   Potassium 3.8 3.5 - 5.1 mmol/L   Chloride 99 (L) 101 - 111 mmol/L   CO2 25 22 - 32 mmol/L   Glucose, Bld 99 65 - 99 mg/dL   BUN 17 6 - 20  mg/dL   Creatinine, Ser 0.72 0.61 - 1.24 mg/dL   Calcium 9.0 8.9 - 10.3 mg/dL   GFR calc non Af Amer >60 >60 mL/min   GFR calc Af Amer >60 >60 mL/min    Comment: (NOTE) The eGFR has been calculated using the CKD EPI equation. This calculation has not been validated in all clinical situations. eGFR's persistently <60 mL/min signify possible Chronic Kidney Disease.    Anion gap 13 5 - 15    Dg Chest 2 View  10/25/2014   CLINICAL DATA:  Cough  EXAM: CHEST - 2 VIEW  COMPARISON:  01/03/2012  FINDINGS: Cardiac shadow is stable. The lungs are well aerated bilaterally. Left basilar atelectasis/scarring is seen. Bony structures are within normal limits. Aortic calcifications are again noted.  IMPRESSION: Changes in the left base which may represent scarring although acute atelectasis would deserve consideration as well. No focal confluent infiltrate is seen.   Electronically Signed   By: Inez Catalina M.D.   On: 10/25/2014 15:51   Dg Pelvis 1-2 Views  10/25/2014    CLINICAL DATA:  Left leg pain.  Fall.  EXAM: PELVIS - 1-2 VIEW  COMPARISON:  11/30/2008  FINDINGS: Left hip hemiarthroplasty is anatomically aligned. No breakage or loosening of the hardware. Dynamic compression screw and sideplate in the proximal right femur is stable. Osteopenia. No fracture.  IMPRESSION: No acute bony pathology.   Electronically Signed   By: Marybelle Killings M.D.   On: 10/25/2014 18:14   Ct Angio Chest Pe W/cm &/or Wo Cm  10/25/2014   CLINICAL DATA:  Fall.  Hypoxia.  EXAM: CT ANGIOGRAPHY CHEST WITH CONTRAST  TECHNIQUE: Multidetector CT imaging of the chest was performed using the standard protocol during bolus administration of intravenous contrast. Multiplanar CT image reconstructions and MIPs were obtained to evaluate the vascular anatomy.  CONTRAST:  142m OMNIPAQUE IOHEXOL 350 MG/ML SOLN  COMPARISON:  01/02/2012  FINDINGS: There are no filling defects in the pulmonary arterial tree to suggest acute pulmonary thromboembolism.  No evidence of aortic dissection. Atherosclerotic changes involving the aortic arch are noted.  Small scattered mediastinal nodes. Mildly enlarged precarinal lymph node is stable.  No pericardial effusion.  No pleural effusion.  No pneumothorax.  Minimal patchy consolidation in the dependent right lower lobe. There is more confluent consolidation in the dependent and basilar left lower lobe. There is also consolidation in the lingula.  Cholelithiasis.  No acute bony deformity.  Review of the MIP images confirms the above findings.  IMPRESSION: No evidence of acute pulmonary thromboembolism.  There is consolidation in the right lower lobe, left lower lobe, and lingula worrisome for pneumonia or aspiration.  Stable mild mediastinal adenopathy.  Cholelithiasis.   Electronically Signed   By: AMarybelle KillingsM.D.   On: 10/25/2014 21:58   Ct Hip Left Wo Contrast  10/25/2014   CLINICAL DATA:  [place] striking left side of the body.  EXAM: CT OF THE LEFT HIP WITHOUT CONTRAST   TECHNIQUE: Multidetector CT imaging of the left hip was performed according to the standard protocol. Multiplanar CT image reconstructions were also generated.  COMPARISON:  Radiographs 10/25/2014.  CT 12/27/2008.  FINDINGS: There is irregularity and cortical disruption across the greater trochanter consistent with an acute fracture. This is nondisplaced. The hip prosthesis is intact. Acetabulum and pubic rami are intact. No significant hematoma.  IMPRESSION: Nondisplaced greater trochanteric fracture.   Electronically Signed   By: DAndreas NewportM.D.   On: 10/25/2014 20:26   Dg  Femur Min 2 Views Left  10/25/2014   CLINICAL DATA:  Status post fall.  Lateral proximal femur pain  EXAM: LEFT FEMUR 2 VIEWS  COMPARISON:  None.  FINDINGS: There is a left hip arthroplasty. There is no fracture or dislocation. There is no hardware failure or complication. There is generalized osteopenia. There is no aggressive lytic or sclerotic osseous lesion.  IMPRESSION: No acute osseous injury of the left femur.   Electronically Signed   By: Kathreen Devoid   On: 10/25/2014 18:13    Review of Systems  Gastrointestinal: Positive for constipation.  Musculoskeletal: Positive for joint pain.   Blood pressure 147/54, pulse 74, temperature 99 F (37.2 C), temperature source Oral, resp. rate 20, SpO2 97 %. Physical Exam  Constitutional: He appears well-developed and well-nourished.  HENT:  Head: Normocephalic and atraumatic.  Neck: Normal range of motion. Neck supple.  Respiratory: Effort normal.  Musculoskeletal:  Mild pain with palpation of the left hip.  Mild bruising over greater troch region.  Pt is able to wiggle toes.  His calves are soft and nontender. Pulses intact distally.  Neurological: He is alert.  Skin: Skin is warm and dry.    Assessment/Plan:  Non displaced greater trochanteric fracture only visible on CT  This patient is discussed with Dr. Mayer Camel.  At this time it appears the pt is not in need of  surgical intervention for a non displaced greater trochanteric fracture only visible on CT.  He is WBAT with a walker.  We anticipate d/c to SNF when medically stable.  Pt will need to follow up in the clinic in 10-14 days.  ASA 325 BID x 2 weeks for DVT prophylaxis.  PHILLIPS, ERIC R 10/26/2014, 7:26 AM

## 2014-10-26 NOTE — Evaluation (Signed)
Occupational Therapy Evaluation Patient Details Name: Cole Cunningham MRN: [id number] DOB: 1932/04/26 Today's Date: 10/26/2014    History of Present Illness 81 y.o. male h/o CVA, non-verbal since then, normally ambulates with walker at baseline, who suffered a mechanical fall 3 days ago. He landed on his L hip. Had pain and inability to ambulate since the fall. Was taken to [facility] ED where X ray was negative for acute fracture. 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 He was sent home but since that time has been unable to ambulate. Any movement of the L hip makes pain worse. Pain is severe with movement.   Clinical Impression   Unsure of pts prior level of function with ADLs and functional mobility due to expressive language deficits. Pt able to complete UB dressing and doff/don socks with min assist. Pt currently total assist +2 for bed mobility and sit <> stand. Max assist +2 for stand pivot transfer from EOB to chair. Per pt report, wife is available to assist 24/7 upon return home, however, it is unclear how much she can assist physically. Recommending ST SNF in order to maximize pts independence and safety with ADLs and functional mobility prior to returning home. Pt would benefit from skilled OT services to increase independence and safety with functional transfers and LB ADLs needed to return to PLOF.   Follow Up Recommendations  SNF;Supervision/Assistance - 24 hour    Equipment Recommendations  Other (comment) (defer to next venue)    Recommendations for Other Services       Precautions / Restrictions Precautions Precautions: Fall Restrictions Weight Bearing Restrictions: Yes LLE Weight Bearing: Weight bearing as tolerated      Mobility Bed Mobility Overal bed mobility: Needs Assistance;+2 for physical assistance Bed Mobility: Supine to Sit     Supine to sit: Total assist;+2 for physical assistance;HOB elevated      General bed mobility comments: Pt able to manage legs to EOB. Required total assist x 2 to shift hips to EOB with bed pad. Support for B legs required to bring legs off of bed to rest on ground. Total assist to scoot forward to EOB  Transfers Overall transfer level: Needs assistance Equipment used: Rolling walker (2 wheeled) Transfers: Sit to/from [facility] Sit to Stand: Total assist;+2 physical assistance Stand pivot transfers: Max assist;+2 physical assistance            Balance Overall balance assessment: Needs assistance Sitting-balance support: Feet supported;No upper extremity supported Sitting balance-Leahy Scale: Fair     Standing balance support: Bilateral upper extremity supported;Single extremity supported Standing balance-Leahy Scale: Poor Standing balance comment: Pt able to remove one hand from walker to complete toilet hygiene                            ADL Overall ADL's : Needs assistance/impaired                 Upper Body Dressing : Minimal assistance;Sitting Upper Body Dressing Details (indicate cue type and reason): Min A to don hospital gown Lower Body Dressing: Minimal assistance Lower Body Dressing Details (indicate cue type and reason): Min A to doff/don B socks, increased time required Toilet Transfer: Maximal assistance;+2 for physical assistance;Stand-pivot;BSC;RW   Toileting- Clothing Manipulation and Hygiene: Maximal assistance Toileting - Clothing Manipulation Details (indicate cue type and reason): Max assist for balance while pt completed toilet hygiene  Functional mobility during ADLs: Maximal assistance;+2 for physical assistance;Rolling walker;Cueing for sequencing General ADL Comments: No family present during OT eval.     Vision     Perception     Praxis      Pertinent Vitals/Pain Pain Assessment: 0-10 Pain Score: 8  Pain Location: L hip Pain Descriptors / Indicators:  Grimacing;Guarding;Sore Pain Intervention(s): Limited activity within patient's tolerance;Monitored during session;Repositioned     Hand Dominance Right   Extremity/Trunk Assessment Upper Extremity Assessment Upper Extremity Assessment: Generalized weakness   Lower Extremity Assessment Lower Extremity Assessment: Defer to PT evaluation   Cervical / Trunk Assessment Cervical / Trunk Assessment: Kyphotic   Communication Communication Communication: Expressive difficulties   Cognition Arousal/Alertness: Awake/alert Behavior During Therapy: WFL for tasks assessed/performed Overall Cognitive Status: Within Functional Limits for tasks assessed                     General Comments       Exercises       Shoulder Instructions      Home Living Family/patient expects to be discharged to:: Unsure Living Arrangements: Spouse/significant other Available Help at Discharge: Family;Available 24 hours/day Type of Home: House Home Access: Level entry     Home Layout: One level     Bathroom Shower/Tub: Occupational psychologist: Standard     Home Equipment: Environmental consultant - 2 wheels;Cane - single point;Shower seat   Additional Comments: Pt reports home information presented above, unsure of accuracy of information. No family present to confirm      Prior Functioning/Environment Level of Independence: Needs assistance    ADL's / Homemaking Assistance Needed: Pt reported that he was independent with ADLs PTA. After further questioning, pt reported that his wife helps with bathing. Unsure of prior level of function with ADLs and functional mobility.        OT Diagnosis: Generalized weakness;Acute pain   OT Problem List: Decreased strength;Decreased range of motion;Decreased activity tolerance;Impaired balance (sitting and/or standing);Decreased safety awareness;Decreased knowledge of use of DME or AE;Decreased knowledge of precautions;Pain   OT  Treatment/Interventions: Self-care/ADL training;DME and/or AE instruction;Patient/family education    OT Goals(Current goals can be found in the care plan section) Acute Rehab OT Goals Patient Stated Goal: none stated OT Goal Formulation: With patient Time For Goal Achievement: 11/09/14 Potential to Achieve Goals: Good ADL Goals Pt Will Perform Grooming: with min assist;standing Pt Will Perform Lower Body Bathing: sit to/from stand;with min assist Pt Will Perform Lower Body Dressing: with min assist;sit to/from stand Pt Will Transfer to Toilet: with min assist;ambulating Pt Will Perform Tub/Shower Transfer: with min assist;ambulating;shower seat;Shower transfer;rolling walker  OT Frequency: Min 2X/week   Barriers to D/C:            Co-evaluation PT/OT/SLP Co-Evaluation/Treatment: Yes Reason for Co-Treatment: Complexity of the patient's impairments (multi-system involvement);For patient/therapist safety   OT goals addressed during session: ADL's and self-care      End of Session Equipment Utilized During Treatment: Gait belt;Rolling walker Nurse Communication: Mobility status;Other (comment) (pt with pink drainage on bed pad )  Activity Tolerance: Patient limited by pain Patient left: in chair;with call bell/phone within reach   Time: 1191-4782 OT Time Calculation (min): 32 min Charges:  OT General Charges $OT Visit: 1 Procedure OT Evaluation $Initial OT Evaluation Tier I: 1 Procedure G-Codes:     Binnie Kand M.S., OTR/L Pager: [phone]  10/26/2014, 4:49 PM

## 2014-10-26 NOTE — Clinical Documentation Improvement (Signed)
Hospitalist  Can the diagnosis of pressure ulcer be further specified? Please update your documentation within the medical record to reflect your response to this query. Thank you   Document if pressure ulcer with stage is Present on Admission   Document Site with laterality - Elbow, Back (upper/lower), Sacral, Hip, Buttock, Ankle, Heel, Head, Other (Specify)  Pressure Ulcer Stage - Stage1, Stage 2, Stage 3, Stage 4, Unstageable, Unspecified, Unable to Clinically Determine  Document any associated diagnoses/conditions such as: with gangrene  Other  Clinically Undetermined  Supporting Information: 10/26/14: per Active problem list:"Pressure ulcer" 10/26/14 Nutrition assessment.Marland KitchenMarland Kitchen"Skin: Wound (see comment) (stage II pressure ulcer to buttocks; stage I pressure ulcer to sacrum)"...  Please exercise your independent, professional judgment when responding. A specific answer is not anticipated or expected.  Thank You,  Ermelinda Das, RN, BSN, [place] Certified Clinical Documentation Specialist [place]: Health Information Management [phone]

## 2014-10-26 NOTE — Progress Notes (Signed)
Initial Nutrition Assessment  DOCUMENTATION CODES:   Not applicable  INTERVENTION:    Ensure Enlive po BID, each supplement provides 350 kcal and 20 grams of protein  NUTRITION DIAGNOSIS:   Increased nutrient needs related to wound healing as evidenced by estimated needs.  GOAL:   Patient will meet greater than or equal to 90% of their needs  MONITOR:   PO intake, Supplement acceptance, Weight trends  REASON FOR ASSESSMENT:   Low Braden    ASSESSMENT:   81 y.o. male h/o CVA, non-verbal since then, normally ambulates with walker at baseline, who suffered a mechanical fall 3 days ago. Admitted on 10/6 with nondisplaced left trochanteric fx, CAP, hypoxia.   Unable to complete Nutrition-Focused physical exam at this time. Patient with increased nutrient needs to support wound healing.    Diet Order:  Diet Heart Room service appropriate?: Yes; Fluid consistency:: Thin  Skin:  Wound (see comment) (stage II pressure ulcer to buttocks; stage I pressure ulcer to sacrum)  Last BM:  10/7  Height:   Ht Readings from Last 1 Encounters:  10/22/14 5\' 4"  (1.626 m)    Weight:   Wt Readings from Last 1 Encounters:  10/22/14 140 lb (63.504 kg)    Ideal Body Weight:  59.1 kg  BMI:  There is no weight on file to calculate BMI.  Estimated Nutritional Needs:   Kcal:  1600-1800  Protein:  80-95 gm  Fluid:  1.6-1.8 L  EDUCATION NEEDS:   No education needs identified at this time  Molli Barrows, [place], [place], [place] Pager [phone] After Hours Pager [phone]

## 2014-10-26 NOTE — Care Management (Signed)
ED CM and Dr. Alcario Drought met with patient and wife a left hip fracture, Dr. Alcario Drought will consult with Ortho concerning bringing patient in for an admission.

## 2014-10-26 NOTE — Care Management (Signed)
ED CM consulted concerning possible HH recommendation. ED CM reviewed patient's record. Patient presented to [facility] ED with recurrent falls, as per wife. CM met with wife  Patient is non verbal.  At bedside Patient is not  able to weight bear on Left since the last fall, wife is primary caregiver, she states, she is not able to lift him to assist with ADL's  ED evaluation X-Ray were neg for acute fracture,  CT of hip ordered and pending. Discussed with wife if no acute findings, we will sent him home with HH supportive services and equipment to assist with ambulation, wife is amendable. Explained to wife ED eval is still in progress awaiting the hip CT, CM will continue to follow for disposition. 

## 2014-10-27 DIAGNOSIS — L8990 Pressure ulcer of unspecified site, unspecified stage: Secondary | ICD-10-CM

## 2014-10-27 DIAGNOSIS — D72829 Elevated white blood cell count, unspecified: Secondary | ICD-10-CM

## 2014-10-27 LAB — URINE MICROSCOPIC-ADD ON

## 2014-10-27 LAB — URINALYSIS, ROUTINE W REFLEX MICROSCOPIC
GLUCOSE, UA: 100 mg/dL — AB
Hgb urine dipstick: NEGATIVE
KETONES UR: 15 mg/dL — AB
NITRITE: NEGATIVE
PH: 5.5 (ref 5.0–8.0)
Protein, ur: NEGATIVE mg/dL
SPECIFIC GRAVITY, URINE: 1.036 — AB (ref 1.005–1.030)
Urobilinogen, UA: 1 mg/dL (ref 0.0–1.0)

## 2014-10-27 LAB — CBC
HEMATOCRIT: 37 % — AB (ref 39.0–52.0)
HEMOGLOBIN: 12.7 g/dL — AB (ref 13.0–17.0)
MCH: 30.9 pg (ref 26.0–34.0)
MCHC: 34.3 g/dL (ref 30.0–36.0)
MCV: 90 fL (ref 78.0–100.0)
Platelets: 145 10*3/uL — ABNORMAL LOW (ref 150–400)
RBC: 4.11 MIL/uL — AB (ref 4.22–5.81)
RDW: 13.6 % (ref 11.5–15.5)
WBC: 7.7 10*3/uL (ref 4.0–10.5)

## 2014-10-27 LAB — HIV ANTIBODY (ROUTINE TESTING W REFLEX): HIV SCREEN 4TH GENERATION: NONREACTIVE

## 2014-10-27 LAB — STREP PNEUMONIAE URINARY ANTIGEN: STREP PNEUMO URINARY ANTIGEN: NEGATIVE

## 2014-10-27 MED ORDER — METHOCARBAMOL 500 MG PO TABS: 500.0000 mg | ORAL_TABLET | Freq: Four times a day (QID) | ORAL | Status: DC | PRN

## 2014-10-27 MED ORDER — ENSURE ENLIVE PO LIQD: 237.0000 mL | Freq: Two times a day (BID) | ORAL | Status: DC

## 2014-10-27 NOTE — Discharge Summary (Signed)
Physician Discharge Summary  [facility] XTG:[id number] DOB: 1932-05-16 DOA: 10/25/2014  PCP: Cole Blackbird, MD  Admit date: 10/25/2014 Discharge date: 10/27/2014  Time spent: 45 minutes  Recommendations for Outpatient Follow-up:  Patient will be discharged to [place].  Patient will need to follow up with primary care provider within one week of discharge.  Patient will also need to follow up with Dr. Frederik Pear, orthopedics, in 10-14 days.  Patient should continue medications as prescribed.  Patient should follow a heart healthy diet.   Discharge Diagnoses:  Nondisplaced left trochanteric fracture Community acquired pneumonia Hypoxia History of CVA with aphasia History of colon cancer Leukocytosis Pressure ulcer  Discharge Condition: Stable  Diet recommendation: heart healthy  There were no vitals filed for this visit.  History of present illness:  on 10/25/2014 by Dr. Jennette Kettle Cole Cunningham is a 81 y.o. male h/o CVA, non-verbal since then, normally ambulates with walker at baseline, who suffered a mechanical fall 3 days ago. He landed on his L hip. Had pain and inability to ambulate since the fall. Was taken to [facility] ED where X ray was negative for acute fracture. 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 He was sent home but since that time has been unable to ambulate. Any movement of the L hip makes pain worse. Pain is severe with movement. While in ED he was noted to be desating to 88% on RA per EDP (92% on my exam). Normally has normal lungs. Patient indicates he is asymptomatic from pulmonary standpoint.  Hospital Course:  Nondisplaced Left trochanteric fracture -Seen on CT  -Orthopedic surgery consulted and appreciated, patient currently does not need surgical intervention, will follow up in the clinic 10-14 days. Weightbearing as tolerated with a walker -PT and OT consulted and  recommended SNF -Will discharge with pain medicine and muscle relaxer  Community-acquired pneumonia -CTA chest was negative for pulmonary embolism although there was noted right lower lobe and left lower lobe consolidation -Pending pneumonia workup, sputum culture and Gram stain, urine strep pneumonia and legionella antigens -Blood cultures negative to date -Initially placed on azithromycin and ceftriaxone -Will discharge with doxycycline  Hypoxia -A secondary to the above -Patient was noted to have 88% oxygen saturation on room air -Continue supplemental oxygen to maintain above 92%  History of CVA with aphasia -Continue aspirin -Currently on no statin  History of colon cancer  Leukocytosis -Resolved, possibly secondary to hip fracture vs pna -Repeat CBC in one week  Pressure ulcer  -present on admission -Stage II on buttocks, stage 1 on sacrum  Procedures  None  Consults  Orthopedic surgery  Discharge Exam: Filed Vitals:   10/27/14 0608  BP: 132/47  Pulse: 60  Temp: 98 F (36.7 C)  Resp: 18    Exam  General: Well developed, thin, NAD, appears stated age  HEENT: NCAT, mucous membranes moist.   Cardiovascular: S1 S2 auscultated, no rubs, murmurs or gallops. Regular rate and rhythm.  Respiratory: Clear to auscultation bilaterally with equal chest rise  Abdomen: Soft, nontender, nondistended, + bowel sounds  Extremities: warm dry without cyanosis clubbing or edema. Left hip TTP  Neuro: AAOx3, nonfocal   Discharge Instructions      Discharge Instructions    Discharge instructions    Complete by:  As directed   Patient will be discharged to [facility] facility.  Patient will need to follow up with primary care provider within one week  of discharge.  Patient will also need to follow up with Dr. Frederik Pear, orthopedics, in 10-14 days.  Patient should continue medications as prescribed.  Patient should follow a heart healthy diet.      Weight bearing as tolerated    Complete by:  As directed   Pt is to use Walker            Medication List    TAKE these medications        aspirin EC 325 MG tablet  Take 1 tablet (325 mg total) by mouth 2 (two) times daily.     feeding supplement (ENSURE ENLIVE) Liqd  Take 237 mLs by mouth 2 (two) times daily between meals.     HYDROcodone-acetaminophen 5-325 MG tablet  Commonly known as:  NORCO  Take 1 tablet by mouth every 6 (six) hours as needed.     ibuprofen 200 MG tablet  Commonly known as:  ADVIL,MOTRIN  Take 400 mg by mouth every 6 (six) hours as needed for moderate pain.     methocarbamol 500 MG tablet  Commonly known as:  ROBAXIN  Take 1 tablet (500 mg total) by mouth every 6 (six) hours as needed for muscle spasms.     multivitamin with minerals Tabs tablet  Take 1 tablet by mouth daily.       No Known Allergies Follow-up Information    Follow up with Kerin Salen, MD In 2 weeks.   Specialty:  Orthopedic Surgery   Contact information:   [place] [address] [phone]       Follow up with [name], [name], MD. Schedule an appointment as soon as possible for a visit in 1 week.   Specialty:  Family Medicine   Why:  Hospital follow-up   Contact information:   [address] [place] [address] [phone]        The results of significant diagnostics from this hospitalization (including imaging, microbiology, ancillary and laboratory) are listed below for reference.    Significant Diagnostic Studies: Dg Chest 2 View  10/25/2014   CLINICAL DATA:  Cough  EXAM: CHEST - 2 VIEW  COMPARISON:  01/03/2012  FINDINGS: Cardiac shadow is stable. The lungs are well aerated bilaterally. Left basilar atelectasis/scarring is seen. Bony structures are within normal limits. Aortic calcifications are again noted.  IMPRESSION: Changes in the left base which may represent scarring although acute atelectasis would deserve consideration as well. No focal  confluent infiltrate is seen.   Electronically Signed   By: Inez Catalina M.D.   On: 10/25/2014 15:51   Dg Pelvis 1-2 Views  10/25/2014   CLINICAL DATA:  Left leg pain.  Fall.  EXAM: PELVIS - 1-2 VIEW  COMPARISON:  11/30/2008  FINDINGS: Left hip hemiarthroplasty is anatomically aligned. No breakage or loosening of the hardware. Dynamic compression screw and sideplate in the proximal right femur is stable. Osteopenia. No fracture.  IMPRESSION: No acute bony pathology.   Electronically Signed   By: Marybelle Killings M.D.   On: 10/25/2014 18:14   Ct Angio Chest Pe W/cm &/or Wo Cm  10/25/2014   CLINICAL DATA:  Fall.  Hypoxia.  EXAM: CT ANGIOGRAPHY CHEST WITH CONTRAST  TECHNIQUE: Multidetector CT imaging of the chest was performed using the standard protocol during bolus administration of intravenous contrast. Multiplanar CT image reconstructions and MIPs were obtained to evaluate the vascular anatomy.  CONTRAST:  165mL OMNIPAQUE IOHEXOL 350 MG/ML SOLN  COMPARISON:  01/02/2012  FINDINGS: There are no  filling defects in the pulmonary arterial tree to suggest acute pulmonary thromboembolism.  No evidence of aortic dissection. Atherosclerotic changes involving the aortic arch are noted.  Small scattered mediastinal nodes. Mildly enlarged precarinal lymph node is stable.  No pericardial effusion.  No pleural effusion.  No pneumothorax.  Minimal patchy consolidation in the dependent right lower lobe. There is more confluent consolidation in the dependent and basilar left lower lobe. There is also consolidation in the lingula.  Cholelithiasis.  No acute bony deformity.  Review of the MIP images confirms the above findings.  IMPRESSION: No evidence of acute pulmonary thromboembolism.  There is consolidation in the right lower lobe, left lower lobe, and lingula worrisome for pneumonia or aspiration.  Stable mild mediastinal adenopathy.  Cholelithiasis.   Electronically Signed   By: Marybelle Killings M.D.   On: 10/25/2014 21:58   Ct  Hip Left Wo Contrast  10/25/2014   CLINICAL DATA:  [place], striking left side of the body.  EXAM: CT OF THE LEFT HIP WITHOUT CONTRAST  TECHNIQUE: Multidetector CT imaging of the left hip was performed according to the standard protocol. Multiplanar CT image reconstructions were also generated.  COMPARISON:  Radiographs 10/25/2014.  CT 12/27/2008.  FINDINGS: There is irregularity and cortical disruption across the greater trochanter consistent with an acute fracture. This is nondisplaced. The hip prosthesis is intact. Acetabulum and pubic rami are intact. No significant hematoma.  IMPRESSION: Nondisplaced greater trochanteric fracture.   Electronically Signed   By: Andreas Newport M.D.   On: 10/25/2014 20:26   Dg Hip Unilat With Pelvis 2-3 Views Left  10/22/2014   CLINICAL DATA:  Left hip pain after falling at home last night.  EXAM: DG HIP (WITH OR WITHOUT PELVIS) 2-3V LEFT  COMPARISON:  CT scan 01/02/2012  FINDINGS: There is no acute fracture or dislocation. The left proximal femoral prosthesis is properly located. Dystrophic calcifications in the soft tissues extending from the lesser trochanter and from the posterior superior aspect of the left acetabulum are unchanged since 2013. Pelvic bones are intact. Compression screw and sideplate in the proximal right femur.  IMPRESSION: No acute abnormalities.   Electronically Signed   By: Lorriane Shire M.D.   On: 10/22/2014 08:10   Dg Femur Min 2 Views Left  10/25/2014   CLINICAL DATA:  Status post fall.  Lateral proximal femur pain  EXAM: LEFT FEMUR 2 VIEWS  COMPARISON:  None.  FINDINGS: There is a left hip arthroplasty. There is no fracture or dislocation. There is no hardware failure or complication. There is generalized osteopenia. There is no aggressive lytic or sclerotic osseous lesion.  IMPRESSION: No acute osseous injury of the left femur.   Electronically Signed   By: Kathreen Devoid   On: 10/25/2014 18:13    Microbiology: Recent Results (from the past  240 hour(s))  Blood culture (routine x 2)     Status: None (Preliminary result)   Collection Time: 10/25/14  8:45 PM  Result Value Ref Range Status   Specimen Description BLOOD RIGHT HAND  Final   Special Requests BOTTLES DRAWN AEROBIC AND ANAEROBIC 5CC  Final   Culture NO GROWTH 2 DAYS  Final   Report Status PENDING  Incomplete  Blood culture (routine x 2)     Status: None (Preliminary result)   Collection Time: 10/25/14  8:57 PM  Result Value Ref Range Status   Specimen Description BLOOD RIGHT FOREARM  Final   Special Requests BOTTLES DRAWN AEROBIC AND ANAEROBIC 5CC  Final  Culture NO GROWTH 2 DAYS  Final   Report Status PENDING  Incomplete  Culture, blood (routine x 2) Call MD if unable to obtain prior to antibiotics being given     Status: None (Preliminary result)   Collection Time: 10/26/14  9:20 AM  Result Value Ref Range Status   Specimen Description BLOOD LEFT ANTECUBITAL  Final   Special Requests BOTTLES DRAWN AEROBIC AND ANAEROBIC 10CC  Final   Culture NO GROWTH < 24 HOURS  Final   Report Status PENDING  Incomplete  Culture, blood (routine x 2) Call MD if unable to obtain prior to antibiotics being given     Status: None (Preliminary result)   Collection Time: 10/26/14  9:28 AM  Result Value Ref Range Status   Specimen Description BLOOD BLOOD LEFT HAND  Final   Special Requests BOTTLES DRAWN AEROBIC ONLY 10CC  Final   Culture NO GROWTH < 24 HOURS  Final   Report Status PENDING  Incomplete     Labs: Basic Metabolic Panel:  Recent Labs Lab 10/25/14 1655  NA 137  K 3.8  CL 99*  CO2 25  GLUCOSE 99  BUN 17  CREATININE 0.72  CALCIUM 9.0   Liver Function Tests: No results for input(s): AST, ALT, ALKPHOS, BILITOT, PROT, ALBUMIN in the last 168 hours. No results for input(s): LIPASE, AMYLASE in the last 168 hours. No results for input(s): AMMONIA in the last 168 hours. CBC:  Recent Labs Lab 10/25/14 1655 10/27/14 0436  WBC 14.6* 7.7  NEUTROABS 12.7*  --     HGB 14.3 12.7*  HCT 42.7 37.0*  MCV 90.7 90.0  PLT 153 145*   Cardiac Enzymes: No results for input(s): CKTOTAL, CKMB, CKMBINDEX, TROPONINI in the last 168 hours. BNP: BNP (last 3 results) No results for input(s): BNP in the last 8760 hours.  ProBNP (last 3 results) No results for input(s): PROBNP in the last 8760 hours.  CBG: No results for input(s): GLUCAP in the last 168 hours.     SignedCristal Ford  [facility] 10/27/2014, 11:47 AM

## 2014-10-27 NOTE — Clinical Social Work Placement (Signed)
   CLINICAL SOCIAL WORK PLACEMENT  NOTE  Date:  10/27/2014  Patient Details  Name: JAIDON ELLERY MRN: [id number] Date of Birth: 1932-12-08  Clinical Social Work is seeking post-discharge placement for this patient at the [place] level of care (*CSW will initial, date and re-position this form in  chart as items are completed):  Yes   Patient/family provided with [place] Work Department's list of facilities offering this level of care within the geographic area requested by the patient (or if unable, by the patient's family).  Yes   Patient/family informed of their freedom to choose among providers that offer the needed level of care, that participate in Medicare, Medicaid or managed care program needed by the patient, have an available bed and are willing to accept the patient.  Yes   Patient/family informed of [facility]'s ownership interest in [facility] and [facility], as well as of the fact that they are under no obligation to receive care at these facilities.  PASRR submitted to EDS on 10/27/14     PASRR number received on 10/27/14     Existing PASRR number confirmed on       FL2 transmitted to all facilities in geographic area requested by pt/family on 10/27/14     FL2 transmitted to all facilities within larger geographic area on       Patient informed that his/her managed care company has contracts with or will negotiate with certain facilities, including the following:        Yes   Patient/family informed of bed offers received.  Patient chooses bed at  ([facility])     Physician recommends and patient chooses bed at      Patient to be transferred to  ([facility]) on 10/27/14.  Patient to be transferred to facility by  Corey Harold )     Patient family notified on 10/27/14 of transfer.  Name of family member notified:   (Pt's wife, June via phone. )     PHYSICIAN Please sign FL2      Additional Comment:    _______________________________________________ Glendon Axe, MSW, LCSWA [phone] 10/27/2014 11:52 AM

## 2014-10-27 NOTE — Discharge Instructions (Signed)
Hip Fracture A hip fracture is a fracture of the upper part of your thigh bone (femur).  CAUSES A hip fracture is caused by a direct blow to the side of your hip. This is usually the result of a fall but can occur in other circumstances, such as an automobile accident. RISK FACTORS There is an increased risk of hip fractures in people with:  An unsteady walking pattern (gait) and those with conditions that contribute to poor balance, such as Parkinson's disease or dementia.  Osteopenia and osteoporosis.  Cancer that spreads to the leg bones.  Certain metabolic diseases. SYMPTOMS  Symptoms of hip fracture include:  Pain over the injured hip.  Inability to put weight on the leg in which the fracture occurred (although, some patients are able to walk after a hip fracture).  Toes and foot of the affected leg point outward when you lie down. DIAGNOSIS A physical exam can determine if a hip fracture is likely to have occurred. X-ray exams are needed to confirm the fracture and to look for other injuries. The X-ray exam can help to determine the type of hip fracture. Rarely, the fracture is not visible on an X-ray image and a CT scan or MRI will have to be done. TREATMENT  The treatment for a fracture is usually surgery. This means using a screw, nail, or rod to hold the bones in place.  HOME CARE INSTRUCTIONS Take all medicines as directed by your health care provider. SEEK MEDICAL CARE IF: Pain continues, even after taking pain medicine. MAKE SURE YOU:  Understand these instructions.   Will watch your condition.  Will get help right away if you are not doing well or get worse.   This information is not intended to replace advice given to you by your health care provider. Make sure you discuss any questions you have with your health care provider.   Document Released: 01/05/2005 Document Revised: 01/10/2013 Document Reviewed: 08/17/2012 Elsevier Interactive Patient Education 2016  Elsevier Inc.  

## 2014-10-27 NOTE — Clinical Social Work Note (Signed)
Clinical Social Work Assessment  Patient Details  Name: Cole Cunningham MRN: [id number] Date of Birth: 09/12/1932  Date of referral:  10/27/14               Reason for consult:  Facility Placement, Discharge Planning                Permission sought to share information with:  Family Supports, Customer service manager, Case Manager Permission granted to share information::  Yes, Verbal Permission Granted  Name::      (June [facility] )  Agency::   (SNF's )  Relationship::   (Spouse )  Contact Information:   [phone])  Housing/Transportation Living arrangements for the past 2 months:  Single Family Home Source of Information:  Spouse Patient Interpreter Needed:  None Criminal Activity/Legal Involvement Pertinent to Current Situation/Hospitalization:  No - Comment as needed Significant Relationships:  Spouse Lives with:  Spouse Do you feel safe going back to the place where you live?  No Need for family participation in patient care:  No (Coment)  Care giving concerns:  Patient requiring continued therapy at SNF.  Social Worker assessment / plan:  CSW spoke with pt's wife in reference to SNF placement. CSW introduced CSW role. CSW also reviewed SNF list. Pt's wife requesting [facility]. No further concerns reported at this time.   Employment status:  Retired Nurse, adult PT Recommendations:  [place] / Referral to community resources:  [place]  Patient/Family's Response to care:  Pt disoriented. Pt's wife agreeable to SNF placement. Pt's wife involved in care and supportive of discharge planning.   Patient/Family's Understanding of and Emotional Response to Diagnosis, Current Treatment, and Prognosis:  CSW unsure of pt and family's understanding of medical intervention etc.  Emotional Assessment Appearance:  Appears stated age Attitude/Demeanor/Rapport:  Unable to  Assess Affect (typically observed):  Unable to Assess Orientation:  Oriented to Self Alcohol / Substance use:  Not Applicable Psych involvement (Current and /or in the community):  No (Comment)  Discharge Needs  Concerns to be addressed:  Care Coordination Readmission within the last 30 days:  No Current discharge risk:  Dependent with Mobility Barriers to Discharge:  Continued Medical Work up   [employer], MSW, LCSWA [phone] 10/27/2014 10:37 AM

## 2014-10-27 NOTE — Clinical Social Work Placement (Signed)
   CLINICAL SOCIAL WORK PLACEMENT  NOTE  Date:  10/27/2014  Patient Details  Name: Cole Cunningham MRN: [id number] Date of Birth: 04/20/1932  Clinical Social Work is seeking post-discharge placement for this patient at the [place] level of care (*CSW will initial, date and re-position this form in  chart as items are completed):  Yes   Patient/family provided with [facility] Work Department's list of facilities offering this level of care within the geographic area requested by the patient (or if unable, by the patient's family).  Yes   Patient/family informed of their freedom to choose among providers that offer the needed level of care, that participate in Medicare, Medicaid or managed care program needed by the patient, have an available bed and are willing to accept the patient.  Yes   Patient/family informed of [facility]'s ownership interest in [facility] and [facility], as well as of the fact that they are under no obligation to receive care at these facilities.  PASRR submitted to EDS on 10/27/14     PASRR number received on 10/27/14     Existing PASRR number confirmed on       FL2 transmitted to all facilities in geographic area requested by pt/family on 10/27/14     FL2 transmitted to all facilities within larger geographic area on       Patient informed that his/her managed care company has contracts with or will negotiate with certain facilities, including the following:            Patient/family informed of bed offers received.  Patient chooses bed at       Physician recommends and patient chooses bed at      Patient to be transferred to   on  .  Patient to be transferred to facility by       Patient family notified on   of transfer.  Name of family member notified:        PHYSICIAN Please sign FL2     Additional Comment:    _______________________________________________ Glendon Axe, MSW,  LCSWA [phone] 10/27/2014 10:37 AM

## 2014-10-27 NOTE — Clinical Social Work Note (Signed)
Clinical Social Worker facilitated patient discharge including contacting patient family and facility to confirm patient discharge plans.  Clinical information faxed to facility and family agreeable with plan.  CSW arranged ambulance transport via [facility] to [facility].  RN to call report prior to discharge.  DC packet prepared and on chart for transport with number for report.   Clinical Social Worker will sign off for now as social work intervention is no longer needed. Please consult [place] again if new need arises.  Glendon Axe, MSW, LCSWA [phone] 10/27/2014 11:53 AM

## 2014-10-27 NOTE — Care Management Note (Addendum)
Case Management Note  Patient Details  Name: Cole Cunningham MRN: [id number] Date of Birth: Feb 16, 1932  Subjective/Objective:  81 yo M sustained a nondisplaced L trochanteric fx after falling; currently not needing surgical intervention            Action/Plan: PT is recommending SNF   Expected Discharge Date:      10/27/14        Expected Discharge Plan:  Skilled Nursing Facility  In-House Referral:  Clinical Social Work  Discharge planning Services  CM Consult  Post Acute Care Choice:    Choice offered to:     DME Arranged:    DME Agency:     HH Arranged:    [place] Agency:     Status of Service:  Completed, signed off  Medicare Important Message Given:    Date Medicare IM Given:    Medicare IM give by:    Date Additional Medicare IM Given:   10/27/14 Additional Medicare Important Message give by:   Frann Rider, RN, BSN  If discussed at [employer] of Stay Meetings, dates discussed:    Additional Comments: pt to be d/c to SNF  Norina Buzzard, RN 10/27/2014, 12:46 PM

## 2014-10-27 NOTE — Clinical Social Work Note (Signed)
Patient has a bed at [facility] and [facility].   CSW remains available as needed.   Glendon Axe, MSW, LCSWA [phone] 10/27/2014 11:26 AM

## 2014-10-27 NOTE — Progress Notes (Signed)
    Patient doing well, ND L greater trochanteric fx found on CT, undergoing conservative care tx, has been up with PT/OT WBAT with walker, reports pain in L hip and thigh, just received pain medication per nurse. NL B/B function, has been eating. Hx difficult given pt prior stroke and aphasia   Physical Exam: BP 132/47 mmHg  Pulse 60  Temp(Src) 98 F (36.7 C) (Oral)  Resp 18  SpO2 97%  Sitting up in hospital bed, rubs L hip and thigh area about pain, SCD's in place, distal compartments soft, 2+ DPP, NVI  CBC Latest Ref Rng 10/27/2014 10/25/2014 01/12/2012  WBC 4.0 - 10.5 K/uL 7.7 14.6(H) 3.2(L)  Hemoglobin 13.0 - 17.0 g/dL 12.7(L) 14.3 8.8(L)  Hematocrit 39.0 - 52.0 % 37.0(L) 42.7 29.3(L)  Platelets 150 - 400 K/uL 145(L) 153 185    CMP Latest Ref Rng 10/25/2014 01/07/2012 01/06/2012  Glucose 65 - 99 mg/dL 99 95 102(H)  BUN 6 - 20 mg/dL 17 7 8   Creatinine 0.61 - 1.24 mg/dL 0.72 0.60 0.59  Sodium 135 - 145 mmol/L 137 137 136  Potassium 3.5 - 5.1 mmol/L 3.8 3.7 3.8  Chloride 101 - 111 mmol/L 99(L) 104 103  CO2 22 - 32 mmol/L 25 30 21   Calcium 8.9 - 10.3 mg/dL 9.0 8.1(L) 8.1(L)  Total Protein 6.0 - 8.3 g/dL - - -  Total Bilirubin 0.3 - 1.2 mg/dL - - -  Alkaline Phos 39 - 117 U/L - - -  AST 0 - 37 U/L - - -  ALT 0 - 53 U/L - - -   Non displaced greater trochanteric fracture only visible on CT  - Pt continues to not require surgical intervention for a non displaced greater trochanteric fracture only visible on CT.  - He is WBAT with a walker. - D/C to SNF when medically stable.  - F/U in the clinic in 10-14 days with Dr Mayer Camel. - ASA 325 BID x 2 weeks for DVT prophylaxis. - up with PT/OT - Cont current px med, add robaxin 500 q6 PRN - Clear for D/C to SNF per Ortho

## 2014-10-29 LAB — LEGIONELLA PNEUMOPHILA SEROGP 1 UR AG: L. PNEUMOPHILA SEROGP 1 UR AG: NEGATIVE

## 2014-10-30 LAB — CULTURE, BLOOD (ROUTINE X 2)
CULTURE: NO GROWTH
Culture: NO GROWTH

## 2014-10-31 LAB — CULTURE, BLOOD (ROUTINE X 2)
CULTURE: NO GROWTH
Culture: NO GROWTH

## 2015-05-15 ENCOUNTER — Observation Stay (HOSPITAL_COMMUNITY)
Admission: EM | Admit: 2015-05-15 | Discharge: 2015-05-16 | Disposition: A | Discharge status: Home Health | Payer: Medicare Other | Attending: Internal Medicine | Admitting: Internal Medicine

## 2015-05-15 ENCOUNTER — Encounter (HOSPITAL_COMMUNITY): Payer: Self-pay | Admitting: *Deleted

## 2015-05-15 ENCOUNTER — Emergency Department (HOSPITAL_COMMUNITY): Payer: Medicare Other

## 2015-05-15 ENCOUNTER — Observation Stay (HOSPITAL_COMMUNITY): Payer: Medicare Other

## 2015-05-15 DIAGNOSIS — Z7982 Long term (current) use of aspirin: Secondary | ICD-10-CM | POA: Insufficient documentation

## 2015-05-15 DIAGNOSIS — J449 Chronic obstructive pulmonary disease, unspecified: Secondary | ICD-10-CM | POA: Diagnosis not present

## 2015-05-15 DIAGNOSIS — E785 Hyperlipidemia, unspecified: Secondary | ICD-10-CM | POA: Diagnosis not present

## 2015-05-15 DIAGNOSIS — F329 Major depressive disorder, single episode, unspecified: Secondary | ICD-10-CM | POA: Insufficient documentation

## 2015-05-15 DIAGNOSIS — R030 Elevated blood-pressure reading, without diagnosis of hypertension: Secondary | ICD-10-CM | POA: Insufficient documentation

## 2015-05-15 DIAGNOSIS — Z87891 Personal history of nicotine dependence: Secondary | ICD-10-CM | POA: Diagnosis not present

## 2015-05-15 DIAGNOSIS — Z8673 Personal history of transient ischemic attack (TIA), and cerebral infarction without residual deficits: Secondary | ICD-10-CM | POA: Diagnosis not present

## 2015-05-15 DIAGNOSIS — I10 Essential (primary) hypertension: Secondary | ICD-10-CM | POA: Insufficient documentation

## 2015-05-15 DIAGNOSIS — R531 Weakness: Secondary | ICD-10-CM | POA: Insufficient documentation

## 2015-05-15 DIAGNOSIS — F419 Anxiety disorder, unspecified: Secondary | ICD-10-CM | POA: Insufficient documentation

## 2015-05-15 DIAGNOSIS — M545 Low back pain, unspecified: Secondary | ICD-10-CM

## 2015-05-15 DIAGNOSIS — IMO0001 Reserved for inherently not codable concepts without codable children: Secondary | ICD-10-CM

## 2015-05-15 DIAGNOSIS — R42 Dizziness and giddiness: Secondary | ICD-10-CM | POA: Insufficient documentation

## 2015-05-15 DIAGNOSIS — R5381 Other malaise: Secondary | ICD-10-CM

## 2015-05-15 DIAGNOSIS — W19XXXA Unspecified fall, initial encounter: Secondary | ICD-10-CM | POA: Insufficient documentation

## 2015-05-15 DIAGNOSIS — Z85038 Personal history of other malignant neoplasm of large intestine: Secondary | ICD-10-CM | POA: Insufficient documentation

## 2015-05-15 DIAGNOSIS — Z96641 Presence of right artificial hip joint: Secondary | ICD-10-CM | POA: Diagnosis not present

## 2015-05-15 DIAGNOSIS — R269 Unspecified abnormalities of gait and mobility: Secondary | ICD-10-CM | POA: Diagnosis not present

## 2015-05-15 DIAGNOSIS — G20 Parkinson's disease: Secondary | ICD-10-CM | POA: Diagnosis not present

## 2015-05-15 DIAGNOSIS — M25551 Pain in right hip: Secondary | ICD-10-CM | POA: Insufficient documentation

## 2015-05-15 LAB — BASIC METABOLIC PANEL
ANION GAP: 8 (ref 5–15)
BUN: 16 mg/dL (ref 6–20)
CALCIUM: 8.9 mg/dL (ref 8.9–10.3)
CO2: 27 mmol/L (ref 22–32)
CREATININE: 0.66 mg/dL (ref 0.61–1.24)
Chloride: 106 mmol/L (ref 101–111)
GFR calc Af Amer: >60 mL/min (ref >60)
GLUCOSE: 101 mg/dL — AB (ref 65–99)
Potassium: 3.7 mmol/L (ref 3.5–5.1)
Sodium: 141 mmol/L (ref 135–145)

## 2015-05-15 LAB — URINALYSIS, ROUTINE W REFLEX MICROSCOPIC
BILIRUBIN URINE: NEGATIVE
GLUCOSE, UA: NEGATIVE mg/dL
HGB URINE DIPSTICK: NEGATIVE
KETONES UR: NEGATIVE mg/dL
Leukocytes, UA: NEGATIVE
Nitrite: NEGATIVE
PROTEIN: NEGATIVE mg/dL
Specific Gravity, Urine: 1.026 (ref 1.005–1.030)
pH: 6 (ref 5.0–8.0)

## 2015-05-15 LAB — CBC
HCT: 43.1 % (ref 39.0–52.0)
Hemoglobin: 14.2 g/dL (ref 13.0–17.0)
MCH: 29.2 pg (ref 26.0–34.0)
MCHC: 32.9 g/dL (ref 30.0–36.0)
MCV: 88.7 fL (ref 78.0–100.0)
PLATELETS: 155 10*3/uL (ref 150–400)
RBC: 4.86 MIL/uL (ref 4.22–5.81)
RDW: 14.1 % (ref 11.5–15.5)
WBC: 8.7 10*3/uL (ref 4.0–10.5)

## 2015-05-15 MED ORDER — BISACODYL 5 MG PO TBEC: 5.0000 mg | DELAYED_RELEASE_TABLET | Freq: Every day | ORAL | Status: DC | PRN

## 2015-05-15 MED ORDER — OXYCODONE-ACETAMINOPHEN 5-325 MG PO TABS
1.0000 | ORAL_TABLET | Freq: Four times a day (QID) | ORAL | Status: DC
  Administered 2015-05-15 – 2015-05-16 (×3): 1 via ORAL
  Filled 2015-05-15 (×4): qty 1

## 2015-05-15 MED ORDER — ONDANSETRON HCL 4 MG/2ML IJ SOLN: 4.0000 mg | Freq: Four times a day (QID) | INTRAMUSCULAR | Status: DC | PRN

## 2015-05-15 MED ORDER — OXYCODONE-ACETAMINOPHEN 5-325 MG PO TABS
1.0000 | ORAL_TABLET | Freq: Once | ORAL | Status: AC
  Administered 2015-05-15: 1 via ORAL
  Filled 2015-05-15: qty 1

## 2015-05-15 MED ORDER — HYDRALAZINE HCL 20 MG/ML IJ SOLN
10.0000 mg | Freq: Three times a day (TID) | INTRAMUSCULAR | Status: DC | PRN
  Administered 2015-05-15: 10 mg via INTRAVENOUS
  Filled 2015-05-15: qty 1

## 2015-05-15 MED ORDER — POLYETHYLENE GLYCOL 3350 17 G PO PACK: 17.0000 g | PACK | Freq: Every day | ORAL | Status: DC | PRN

## 2015-05-15 MED ORDER — ONDANSETRON HCL 4 MG PO TABS: 4.0000 mg | ORAL_TABLET | Freq: Four times a day (QID) | ORAL | Status: DC | PRN

## 2015-05-15 MED ORDER — ACETAMINOPHEN 650 MG RE SUPP: 650.0000 mg | Freq: Four times a day (QID) | RECTAL | Status: DC | PRN

## 2015-05-15 MED ORDER — TRAZODONE HCL 50 MG PO TABS: 25.0000 mg | ORAL_TABLET | Freq: Every evening | ORAL | Status: DC | PRN

## 2015-05-15 MED ORDER — ACETAMINOPHEN 325 MG PO TABS
650.0000 mg | ORAL_TABLET | Freq: Four times a day (QID) | ORAL | Status: DC | PRN
  Administered 2015-05-15 – 2015-05-16 (×2): 650 mg via ORAL
  Filled 2015-05-15 (×2): qty 2

## 2015-05-15 MED ORDER — ENOXAPARIN SODIUM 40 MG/0.4ML ~~LOC~~ SOLN
40.0000 mg | SUBCUTANEOUS | Status: DC
  Administered 2015-05-15: 40 mg via SUBCUTANEOUS
  Filled 2015-05-15 (×2): qty 0.4

## 2015-05-15 MED ORDER — ACETAMINOPHEN 325 MG PO TABS
650.0000 mg | ORAL_TABLET | Freq: Once | ORAL | Status: AC
  Administered 2015-05-15: 650 mg via ORAL
  Filled 2015-05-15: qty 2

## 2015-05-15 NOTE — ED Notes (Signed)
Patient transported to CT 

## 2015-05-15 NOTE — ED Notes (Signed)
Stevphen Rochester, PA, made aware of pt's pain level and BP 202/68.

## 2015-05-15 NOTE — Discharge Instructions (Signed)
Please read and follow all provided instructions.  Your diagnoses today include:  1. Fall, initial encounter   2. Right hip pain    Tests performed today include:  Vital signs. See below for your results today.   Medications prescribed:   None  Home care instructions:  Follow any educational materials contained in this packet.  Follow-up instructions: Please follow-up with your primary care provider in the next week for further evaluation of symptoms and treatment   Return instructions:   Please return to the Emergency Department if you do not get better, if you get worse, or new symptoms OR  - Fever (temperature greater than 101.80F)  - Bleeding that does not stop with holding pressure to the area    -Severe pain (please note that you may be more sore the day after your accident)  - Chest Pain  - Difficulty breathing  - Severe nausea or vomiting  - Inability to tolerate food and liquids  - Passing out  - Skin becoming red around your wounds  - Change in mental status (confusion or lethargy)  - New numbness or weakness     Please return if you have any other emergent concerns.  Additional Information:  Your vital signs today were: BP 181/74 mmHg   Pulse 69   Temp(Src) 97.8 F (36.6 C) (Oral)   Resp 18   SpO2 90% If your blood pressure (BP) was elevated above 135/85 this visit, please have this repeated by your doctor within one month. ---------------

## 2015-05-15 NOTE — ED Notes (Signed)
Patient incontinent but felt if i left a urinal he could get something in there

## 2015-05-15 NOTE — H&P (Signed)
History and Physical    Cole Cunningham [id number] DOB: 1932-07-09 DOA: 05/15/2015  Referring MD/NP/PA:  ED- Shary Decamp, P.A. PCP: Antony Blackbird, MD  Outpatient Specialists: None Patient coming from: Home via EMS  Chief Complaint: low back pain post fall  HPI: Cole Cunningham is a 82 y.o. male with medical history significant for CVA,. He has Parkinson's disease, HTN, and COPD listed in [place] but doesn't take any medications at home. Patient's wife Cole Cunningham was transported by EMS to ED today. Patient was attempting to ambulate with walker into the kitchen while wife was when he trip on rug and fell backwards into an ottoman. Patient initially complained of right hip pain. He has a history of right hip replacement. X-rays and CT scan of right hip without any acute abnormalities. Patient was unable to even get out of bed to attempt ambulation while in the emergency department. He has no associated leg pain. His main complaint at present is diffuse low back pain. Pain constant, worse when bending forward. Pain not present prior to fall today.   ED Course:  BP elevated, improved with pain relief.  Chem profile, CBC normal. Urinalysis unremarkable. No acute findings on chest x-ray . EKG not done  Review of Systems: As per HPI otherwise 10 point review of systems negative.   Past Medical History  Diagnosis Date  . Stroke ([place])   . Hip fracture ([place])   . Parkinson's disease ([place])   . COPD (chronic obstructive pulmonary disease) ([place])   . Cancer [facility])     colon cancer  . Hyperlipidemia   . Anxiety   . Depression   . Hypertension     Past Surgical History  Procedure Laterality Date  . Fracture surgery    . Hernia repair    . Eye surgery    . Esophagogastroduodenoscopy  01/01/2012    Procedure: ESOPHAGOGASTRODUODENOSCOPY (EGD);  Surgeon: Winfield Cunas., MD;  Location: [facility] ENDOSCOPY;  Service: Endoscopy;  Laterality: Left;  . Flexible sigmoidoscopy  01/02/2012   Procedure: FLEXIBLE SIGMOIDOSCOPY;  Surgeon: Missy Sabins, MD;  Location: [place];  Service: Endoscopy;  Laterality: N/A;  . Bowel resection  01/05/2012    Procedure: LOW ANTERIOR BOWEL RESECTION;  Surgeon: Edward Jolly, MD;  Location: [place];  Service: General;  Laterality: N/A;     reports that he has quit smoking. He does not have any smokeless tobacco history on file. He reports that he does not drink alcohol or use illicit drugs.  No Known Allergies   FMH:  Family history reviewed and not pertinent  Prior to Admission medications   Medication Sig Start Date End Date Taking? Authorizing Provider  aspirin EC 325 MG tablet Take 1 tablet (325 mg total) by mouth 2 (two) times daily. Patient not taking: Reported on 05/15/2015 10/26/14   Leighton Parody, PA-C  feeding supplement, ENSURE ENLIVE, (ENSURE ENLIVE) LIQD Take 237 mLs by mouth 2 (two) times daily between meals. Patient not taking: Reported on 05/15/2015 10/27/14   Velta Addison Mikhail, DO  HYDROcodone-acetaminophen (NORCO) 5-325 MG tablet Take 1 tablet by mouth every 6 (six) hours as needed. Patient not taking: Reported on 05/15/2015 10/26/14   Leighton Parody, PA-C  methocarbamol (ROBAXIN) 500 MG tablet Take 1 tablet (500 mg total) by mouth every 6 (six) hours as needed for muscle spasms. Patient not taking: Reported on 05/15/2015 10/27/14   Cristal Ford, DO    Physical Exam: Filed Vitals:   05/15/15 1545 05/15/15 1610  05/15/15 1615 05/15/15 1645  BP: 154/58 142/56 159/65 178/77  Pulse: 58 62 64 66  Temp:      TempSrc:      Resp:  18    SpO2: 92% 94% 87% 93%    Constitutional: NAD, calm, comfortable Filed Vitals:   05/15/15 1545 05/15/15 1610 05/15/15 1615 05/15/15 1645  BP: 154/58 142/56 159/65 178/77  Pulse: 58 62 64 66  Temp:      TempSrc:      Resp:  18    SpO2: 92% 94% 87% 93%   Eyes: PERRL, lids and conjunctivae normal ENMT: Mucous membranes are moist. Posterior pharynx clear of any exudate or  lesions.Normal dentition.  Neck: normal, supple, no masses Respiratory: clear to auscultation bilaterally, no wheezing, no crackles. Normal respiratory effort. No accessory muscle use.  Cardiovascular: Regular rate and rhythm, no murmurs / rubs / gallops. No extremity edema. 2+ pedal pulses. .  Abdomen: no tenderness, no masses palpated. No hepatosplenomegaly. Bowel sounds positive.  Musculoskeletal: no clubbing / cyanosis. No joint deformity upper and lower extremities. Good ROM, no contractures. Normal muscle tone. Tender to palpation across complete lower back. No bruising noted.  Skin: RLE with healed crusty sores between ankle and foot. Skin discoloration to BLE.  Neurologic: CN 2-12 grossly intact. Sensation intact, DTR normal. Strength 5/5 in all 4.  Psychiatric: Normal judgment and insight. Alert and oriented x 3. Normal mood.   Labs on Admission: I have personally reviewed following labs and imaging studies  CBC:  Recent Labs Lab 05/15/15 0828  WBC 8.7  HGB 14.2  HCT 43.1  MCV 88.7  PLT 99991111   Basic Metabolic Panel:  Recent Labs Lab 05/15/15 0828  NA 141  K 3.7  CL 106  CO2 27  GLUCOSE 101*  BUN 16  CREATININE 0.66  CALCIUM 8.9   Urine analysis:    Component Value Date/Time   COLORURINE YELLOW 05/15/2015 1220   APPEARANCEUR CLEAR 05/15/2015 1220   LABSPEC 1.026 05/15/2015 1220   PHURINE 6.0 05/15/2015 1220   GLUCOSEU NEGATIVE 05/15/2015 1220   [place] 05/15/2015 1220   [place] 05/15/2015 1220   KETONESUR NEGATIVE 05/15/2015 1220   PROTEINUR NEGATIVE 05/15/2015 1220   UROBILINOGEN 1.0 10/27/2014 1250   NITRITE NEGATIVE 05/15/2015 1220   LEUKOCYTESUR NEGATIVE 05/15/2015 1220    Radiological Exams on Admission: Dg Chest 2 View  05/15/2015  CLINICAL DATA:  Mid back and right hip pain secondary to a fall this morning. EXAM: CHEST  2 VIEW COMPARISON:  Chest x-ray and chest CT dated 10/25/2014 FINDINGS: There is chronic mild cardiomegaly.  The pulmonary vascularity is normal. Slight scarring at the left lung base. The lungs are otherwise clear. Old compression fracture of L1. No acute bone abnormality. Calcification in the aortic arch. IMPRESSION: No active cardiopulmonary disease. Old compression fracture of L1. Aortic atherosclerosis. Electronically Signed   By: Lorriane Shire M.D.   On: 05/15/2015 08:57   Ct Head Wo Contrast  05/15/2015  CLINICAL DATA:  Fall, dizziness. EXAM: CT HEAD WITHOUT CONTRAST TECHNIQUE: Contiguous axial images were obtained from the base of the skull through the vertex without intravenous contrast. COMPARISON:  12/26/2008 FINDINGS: Bilateral basal ganglia lacunar infarcts. There is atrophy and chronic small vessel disease changes. No acute intracranial abnormality. Specifically, no hemorrhage, hydrocephalus, mass lesion, acute infarction, or significant intracranial injury. No acute calvarial abnormality. Visualized paranasal sinuses and mastoids clear. Orbital soft tissues unremarkable. IMPRESSION: Old bilateral basal ganglia lacunar infarcts. No acute intracranial  abnormality. Atrophy, chronic microvascular disease. Electronically Signed   By: Rolm Baptise M.D.   On: 05/15/2015 11:24   Ct Hip Right Wo Contrast  05/15/2015  CLINICAL DATA:  Status post fall today. Right hip pain. History of prior fixation of a right intertrochanteric fracture. Initial encounter. EXAM: CT OF THE RIGHT HIP WITHOUT CONTRAST TECHNIQUE: Multidetector CT imaging of the right hip was performed according to the standard protocol. Multiplanar CT image reconstructions were also generated. COMPARISON:  Plain films right hip 10/25/2014 and earlier today. CT chest, abdomen and pelvis 01/02/2012. FINDINGS: Dynamic hip screw is in place for fixation of remote remote intertrochanteric fracture. The fracture is healed. Hardware is intact without loosening or other complicating feature. No acute fracture is identified. The femoral head is located. Soft  tissue about the right hip are unremarkable. Imaged intrapelvic contents demonstrate no focal abnormality. IMPRESSION: No acute abnormality. Remote healed right intertrochanteric fracture with fixation hardware in place. Electronically Signed   By: Inge Rise M.D.   On: 05/15/2015 15:53   Dg Hip Unilat With Pelvis 2-3 Views Right  05/15/2015  CLINICAL DATA:  Right hip pain secondary to a fall this morning. EXAM: DG HIP (WITH OR WITHOUT PELVIS) 2-3V RIGHT COMPARISON:  Radiographs dated 10/22/2014 FINDINGS: There is no evidence of hip fracture or dislocation. Compression screw and sideplate in the proximal right femur, unchanged. Proximal left femoral prosthesis appears unchanged. Osteopenia. Calcification in the abdominal aorta and iliac arteries. IMPRESSION: No acute bone abnormality.  Aortic atherosclerosis.  Osteopenia. Electronically Signed   By: Lorriane Shire M.D.   On: 05/15/2015 08:54    Assessment/Plan  Fall at home (tripped on rug).  Unwitnessed fall but apparently fell backwards onto an ottoman. Right hip xray and right hip CTscan without acute findings. He mainly complains of low back pain during my interview. No visible bruising. Patient unable to get out of bed in ED without severe pain.  -place in observation -obtain xrays of LS spine -analgesics -kpad to lower back -fall precautions. Hx of falling out of hospital bed -PT evaluation. Social Work consult already placed - home PT?  Other home health needs such as bedside commode? Patient had a Solo plastic cup in his diaper upon arriving to ED.    Elevated blood pressure. Suspect this is secondary to acute pain (it initially improved with pain control).  -Continue prn pain meds -Hydralazine prn SBP > 180.  Hx of colon cancer, s/p LAR 2013. Path c/w invasive moderately differentiated adenocarcinoma with invasion through muscularis propria into subserosal tissue. No evidence for metastatic disease. Margins negative, no tumor in 15  nodes.    DVT prophylaxis: Lovenox Code Status: Full code Family Communication: Spoke with daughter- in-law Cole Cunningham in room. She is married to patient's son Cole Cunningham and they live in close proximity to patient. She understands and agrees with plan of treatment.  Disposition Plan: home in 24-48 hours Consults called: none Admission status: Observation  Tye Savoy NP [facility] Pager [phone]  If 7PM-7AM, please contact night-coverage [email or web address] Password [email or web address]  05/15/2015, 5:10 PM

## 2015-05-15 NOTE — Discharge Planning (Signed)
Talked with daughter, Lynelle Smoke. States parents live at home with her older brother.  Brother is there 24/7, does not work.  Mother is usually the caregiver, however, she is here also and will be admitted to hospital.  Patient has a lift chair at home, usually ambulates with walker around the house.  Other brothers are also available for assistance if needed.   Patient unable to ambulate at this time - presents post fall.  Waiting for scan results.  Family would like to take home if able but would like [place].  Have used [facility] in past and would like for them to visit again.  Will need orders for [facility] RN, PT, NA and SW.  Will also need face to face.  Referral given to [facility] in the event he does go home today.

## 2015-05-15 NOTE — ED Notes (Addendum)
Per EMS- pt was standing when he fell. Denies dizziness prior.  Pt reports mid back pain. Pt has hx of back pain but states that it is now worse. Denies LOC. Pt has hx of stroke and right sided weakness.

## 2015-05-15 NOTE — ED Notes (Signed)
Attempted to walk patient with walker. Tried but he was unable to. Family stated he could use a walker at home.

## 2015-05-15 NOTE — ED Notes (Signed)
PA at bedside.

## 2015-05-15 NOTE — ED Provider Notes (Signed)
CSN: [id number]     Arrival date & time 05/15/15  0754 History   First MD Initiated Contact with Patient 05/15/15 0759     Chief Complaint  Patient presents with  . Fall   (Consider location/radiation/quality/duration/timing/severity/associated sxs/prior Treatment) HPI 82 y.o. male with a hx of CVA, Hip Fx, COPD presents to the Emergency Department today s/p mechanical fall from home. States that he got up from his chair to see his why his wife was going with EMS. He ambulated a few feet with his walker and fell backwards. No head trauma. No LOC. Recalls entire event. Notes right hip/back pain with constant throbbing. Does have hx of back pain. No numbness/tingling. No fevers. No cough/congestion. No dysuria. No CP/SOB/ABD pain. No other symptoms noted.   Past Medical History  Diagnosis Date  . Stroke ([place])   . Hip fracture ([place])   . Parkinson's disease ([place])   . COPD (chronic obstructive pulmonary disease) ([place])   . Cancer [facility])     colon cancer  . Hyperlipidemia   . Anxiety   . Depression   . Hypertension    Past Surgical History  Procedure Laterality Date  . Fracture surgery    . Hernia repair    . Eye surgery    . Esophagogastroduodenoscopy  01/01/2012    Procedure: ESOPHAGOGASTRODUODENOSCOPY (EGD);  Surgeon: Winfield Cunas., MD;  Location: [facility] ENDOSCOPY;  Service: Endoscopy;  Laterality: Left;  . Flexible sigmoidoscopy  01/02/2012    Procedure: FLEXIBLE SIGMOIDOSCOPY;  Surgeon: Missy Sabins, MD;  Location: [place];  Service: Endoscopy;  Laterality: N/A;  . Bowel resection  01/05/2012    Procedure: LOW ANTERIOR BOWEL RESECTION;  Surgeon: Edward Jolly, MD;  Location: [facility] OR;  Service: General;  Laterality: N/A;   Family History  Problem Relation Age of Onset  . Other Neg Hx    Social History  Substance Use Topics  . Smoking status: Former Research scientist (life sciences)  . Smokeless tobacco: None  . Alcohol Use: No    Review of Systems ROS reviewed and all are negative for  acute change except as noted in the HPI.  Allergies  Review of patient's allergies indicates no known allergies.  Home Medications   Prior to Admission medications   Medication Sig Start Date End Date Taking? Authorizing Provider  aspirin EC 325 MG tablet Take 1 tablet (325 mg total) by mouth 2 (two) times daily. 10/26/14   Leighton Parody, PA-C  feeding supplement, ENSURE ENLIVE, (ENSURE ENLIVE) LIQD Take 237 mLs by mouth 2 (two) times daily between meals. 10/27/14   Maryann Mikhail, DO  HYDROcodone-acetaminophen (NORCO) 5-325 MG tablet Take 1 tablet by mouth every 6 (six) hours as needed. 10/26/14   Leighton Parody, PA-C  ibuprofen (ADVIL,MOTRIN) 200 MG tablet Take 400 mg by mouth every 6 (six) hours as needed for moderate pain.     Historical Provider, MD  methocarbamol (ROBAXIN) 500 MG tablet Take 1 tablet (500 mg total) by mouth every 6 (six) hours as needed for muscle spasms. 10/27/14   Maryann Mikhail, DO  Multiple Vitamin (MULTIVITAMIN WITH MINERALS) TABS Take 1 tablet by mouth daily.    Historical Provider, MD   BP 196/79 mmHg  Pulse 69  Temp(Src) 97.8 F (36.6 C) (Oral)  Resp 18  SpO2 95%   Physical Exam  Constitutional: He is oriented to person, place, and time. He appears well-developed and well-nourished.  HENT:  Head: Normocephalic and atraumatic.  Eyes: EOM are normal. Pupils are  equal, round, and reactive to light.  Neck: Normal range of motion.  Cardiovascular: Normal rate, regular rhythm, normal heart sounds and intact distal pulses.   No murmur heard. Pulmonary/Chest: Effort normal and breath sounds normal. No respiratory distress. He has no wheezes. He has no rales. He exhibits no tenderness.  Abdominal: Soft. Bowel sounds are normal. There is no tenderness.  Musculoskeletal:       Right hip: He exhibits decreased range of motion and tenderness. He exhibits no deformity and no laceration.       Lumbar back: Normal. He exhibits normal range of motion, no tenderness,  no bony tenderness, no swelling, no edema, no deformity and no pain.  Distal pulses intact. Cap refill <2sec. Motor/sensory intact. No spinous process tenderness. No visible deformities on exam.   Neurological: He is alert and oriented to person, place, and time. He has normal strength. No cranial nerve deficit or sensory deficit.  Cranial Nerves:  II: Pupils equal, round, reactive to light III,IV, VI: ptosis not present, extra-ocular motions intact bilaterally  V,VII: smile symmetric, facial light touch sensation equal VIII: hearing grossly normal bilaterally  IX,X: midline uvula rise  XI: bilateral shoulder shrug equal and strong XII: midline tongue extension  Skin: Skin is warm and dry.  Psychiatric: He has a normal mood and affect. His behavior is normal. Thought content normal.  Nursing note and vitals reviewed.  ED Course  Procedures (including critical care time) Labs Review Labs Reviewed  BASIC METABOLIC PANEL - Abnormal; Notable for the following:    Glucose, Bld 101 (*)    All other components within normal limits  URINE CULTURE  CBC  URINALYSIS, ROUTINE W REFLEX MICROSCOPIC (NOT AT [facility])   Imaging Review Dg Chest 2 View  05/15/2015  CLINICAL DATA:  Mid back and right hip pain secondary to a fall this morning. EXAM: CHEST  2 VIEW COMPARISON:  Chest x-ray and chest CT dated 10/25/2014 FINDINGS: There is chronic mild cardiomegaly. The pulmonary vascularity is normal. Slight scarring at the left lung base. The lungs are otherwise clear. Old compression fracture of L1. No acute bone abnormality. Calcification in the aortic arch. IMPRESSION: No active cardiopulmonary disease. Old compression fracture of L1. Aortic atherosclerosis. Electronically Signed   By: Lorriane Shire M.D.   On: 05/15/2015 08:57   Ct Head Wo Contrast  05/15/2015  CLINICAL DATA:  Fall, dizziness. EXAM: CT HEAD WITHOUT CONTRAST TECHNIQUE: Contiguous axial images were obtained from the base of the skull through  the vertex without intravenous contrast. COMPARISON:  12/26/2008 FINDINGS: Bilateral basal ganglia lacunar infarcts. There is atrophy and chronic small vessel disease changes. No acute intracranial abnormality. Specifically, no hemorrhage, hydrocephalus, mass lesion, acute infarction, or significant intracranial injury. No acute calvarial abnormality. Visualized paranasal sinuses and mastoids clear. Orbital soft tissues unremarkable. IMPRESSION: Old bilateral basal ganglia lacunar infarcts. No acute intracranial abnormality. Atrophy, chronic microvascular disease. Electronically Signed   By: Rolm Baptise M.D.   On: 05/15/2015 11:24   Ct Hip Right Wo Contrast  05/15/2015  CLINICAL DATA:  Status post fall today. Right hip pain. History of prior fixation of a right intertrochanteric fracture. Initial encounter. EXAM: CT OF THE RIGHT HIP WITHOUT CONTRAST TECHNIQUE: Multidetector CT imaging of the right hip was performed according to the standard protocol. Multiplanar CT image reconstructions were also generated. COMPARISON:  Plain films right hip 10/25/2014 and earlier today. CT chest, abdomen and pelvis 01/02/2012. FINDINGS: Dynamic hip screw is in place for fixation of remote remote  intertrochanteric fracture. The fracture is healed. Hardware is intact without loosening or other complicating feature. No acute fracture is identified. The femoral head is located. Soft tissue about the right hip are unremarkable. Imaged intrapelvic contents demonstrate no focal abnormality. IMPRESSION: No acute abnormality. Remote healed right intertrochanteric fracture with fixation hardware in place. Electronically Signed   By: Inge Rise M.D.   On: 05/15/2015 15:53   Dg Hip Unilat With Pelvis 2-3 Views Right  05/15/2015  CLINICAL DATA:  Right hip pain secondary to a fall this morning. EXAM: DG HIP (WITH OR WITHOUT PELVIS) 2-3V RIGHT COMPARISON:  Radiographs dated 10/22/2014 FINDINGS: There is no evidence of hip fracture or  dislocation. Compression screw and sideplate in the proximal right femur, unchanged. Proximal left femoral prosthesis appears unchanged. Osteopenia. Calcification in the abdominal aorta and iliac arteries. IMPRESSION: No acute bone abnormality.  Aortic atherosclerosis.  Osteopenia. Electronically Signed   By: Lorriane Shire M.D.   On: 05/15/2015 08:54   I have personally reviewed and evaluated these images and lab results as part of my medical decision-making.   EKG Interpretation None      MDM  I have reviewed and evaluated the relevant laboratory values I have reviewed and evaluated the relevant imaging studies.  I have reviewed the relevant previous healthcare records. I obtained HPI from historian. Patient discussed with supervising physician  ED Course:  Assessment: Pt is a 40yM with hx CVA, Fall, Left Hip Fx who presents s/p mechanical fall at home. Uses walker to ambulate normally. Endorses right hip/low right back pain. No spinous process tenderness. On exam, pt in NAD. Nontoxic/nonseptic appearing. VSS. Afebrile. Lungs CTA. Heart RRR. Abdomen nontender soft. Right Hip TTP with Decrease ROM. CBC/BMP unremarkable. CXR unremarkable. CT Head unremarkable. XR Right Hip unremarkable. Attempted to ambulate patient with walker, but was unable due to pain. Pt does have history of mechanical fall last year with Troch Fx of left hip found on CT. Ordered CT right hip, which showed no acute abnormalities. Due to inability to ambulate due to pain, will admit to medicine  Disposition/Plan:  Admit Pt acknowledges and agrees with plan  Supervising Physician Alfonzo Beers, MD   Final diagnoses:  Fall, initial encounter  Right hip pain      Shary Decamp, PA-C 05/15/15 [place], MD 05/20/15 [phone]

## 2015-05-15 NOTE — ED Notes (Signed)
Pt remains monitored by blood pressure and pulse ox. Pt is noted to have family at bedside.

## 2015-05-15 NOTE — ED Notes (Signed)
Patient transported to X-ray 

## 2015-05-15 NOTE — Progress Notes (Signed)
ED CM reviewed patient's record. PT and CSW consult ordered. CM CSW will continue to follow up.

## 2015-05-16 DIAGNOSIS — I6330 Cerebral infarction due to thrombosis of unspecified cerebral artery: Secondary | ICD-10-CM

## 2015-05-16 DIAGNOSIS — W19XXXA Unspecified fall, initial encounter: Secondary | ICD-10-CM | POA: Diagnosis not present

## 2015-05-16 DIAGNOSIS — R030 Elevated blood-pressure reading, without diagnosis of hypertension: Secondary | ICD-10-CM | POA: Diagnosis not present

## 2015-05-16 LAB — BASIC METABOLIC PANEL
ANION GAP: 9 (ref 5–15)
BUN: 18 mg/dL (ref 6–20)
CALCIUM: 8.8 mg/dL — AB (ref 8.9–10.3)
CO2: 24 mmol/L (ref 22–32)
Chloride: 106 mmol/L (ref 101–111)
Creatinine, Ser: 0.65 mg/dL (ref 0.61–1.24)
Glucose, Bld: 115 mg/dL — ABNORMAL HIGH (ref 65–99)
POTASSIUM: 3.8 mmol/L (ref 3.5–5.1)
Sodium: 139 mmol/L (ref 135–145)

## 2015-05-16 LAB — CBC
HCT: 41.4 % (ref 39.0–52.0)
HEMOGLOBIN: 13.7 g/dL (ref 13.0–17.0)
MCH: 29.5 pg (ref 26.0–34.0)
MCHC: 33.1 g/dL (ref 30.0–36.0)
MCV: 89 fL (ref 78.0–100.0)
Platelets: 149 10*3/uL — ABNORMAL LOW (ref 150–400)
RBC: 4.65 MIL/uL (ref 4.22–5.81)
RDW: 14.3 % (ref 11.5–15.5)
WBC: 8.5 10*3/uL (ref 4.0–10.5)

## 2015-05-16 LAB — URINE CULTURE: CULTURE: NO GROWTH

## 2015-05-16 MED ORDER — ASPIRIN 81 MG PO CHEW: 81.0000 mg | CHEWABLE_TABLET | Freq: Every day | ORAL | Status: AC

## 2015-05-16 MED ORDER — ACETAMINOPHEN 500 MG PO TABS
1000.0000 mg | ORAL_TABLET | Freq: Three times a day (TID) | ORAL | Status: DC
  Administered 2015-05-16: 1000 mg via ORAL
  Filled 2015-05-16 (×2): qty 2

## 2015-05-16 MED ORDER — OXYCODONE HCL 5 MG PO TABS: 5.0000 mg | ORAL_TABLET | Freq: Four times a day (QID) | ORAL | Status: AC | PRN

## 2015-05-16 MED ORDER — AMLODIPINE BESYLATE 5 MG PO TABS: 5.0000 mg | ORAL_TABLET | Freq: Every day | ORAL | Status: AC

## 2015-05-16 MED ORDER — POLYETHYLENE GLYCOL 3350 17 G PO PACK
17.0000 g | PACK | Freq: Every day | ORAL | Status: DC
Start: 2015-05-16 — End: 2015-05-16
  Administered 2015-05-16: 17 g via ORAL
  Filled 2015-05-16: qty 1

## 2015-05-16 MED ORDER — ENSURE ENLIVE PO LIQD: 237.0000 mL | Freq: Two times a day (BID) | ORAL | Status: AC

## 2015-05-16 MED ORDER — OXYCODONE HCL 5 MG PO TABS
5.0000 mg | ORAL_TABLET | ORAL | Status: DC | PRN
  Administered 2015-05-16: 5 mg via ORAL
  Filled 2015-05-16: qty 1

## 2015-05-16 MED ORDER — AMLODIPINE BESYLATE 5 MG PO TABS
5.0000 mg | ORAL_TABLET | Freq: Every day | ORAL | Status: DC
  Administered 2015-05-16: 5 mg via ORAL
  Filled 2015-05-16: qty 1

## 2015-05-16 MED ORDER — ASPIRIN 81 MG PO CHEW
81.0000 mg | CHEWABLE_TABLET | Freq: Every day | ORAL | Status: DC
  Administered 2015-05-16: 81 mg via ORAL
  Filled 2015-05-16: qty 1

## 2015-05-16 MED ORDER — ACETAMINOPHEN 500 MG PO TABS: 1000.0000 mg | ORAL_TABLET | Freq: Three times a day (TID) | ORAL | Status: AC | PRN

## 2015-05-16 NOTE — Care Management Note (Addendum)
Case Management Note  Patient Details  Name: JAKYRIE SIMBECK MRN: [id number] Date of Birth: 05-12-1932  Subjective/Objective:                 Attempted to speak with patient in room, patient having difficulty with questions. Granted permission to speak to family. Family not in room x2 attempts, and number for wife in contacts not anwered. Per CM note from ED, PAC choice [facility], referral made for RN OT PT [facility]. Entered in AVS.   Addendum: Spoke with wife (who is currently hospitalized down the hall) and daughter. They were updated on PT note and rec for SNF. They decline SNF and would like to procede with HH. Denied needs for DME.    Action/Plan:  In Obs for Fall, with DC to home today.  Expected Discharge Date:                  Expected Discharge Plan:  [place]  In-House Referral:     Discharge planning Services  CM Consult  Post Acute Care Choice:    Choice offered to:  Adult Children (per note)  DME Arranged:    DME Agency:     HH Arranged:  RN, PT, OT, Nurse's Aide, Social Work [employer] Agency:  [facility]  Status of Service:  Completed, signed off  Medicare Important Message Given:    Date Medicare IM Given:    Medicare IM give by:    Date Additional Medicare IM Given:    Additional Medicare Important Message give by:     If discussed at [place] of Stay Meetings, dates discussed:    Additional Comments:  Carles Collet, RN 05/16/2015, 3:11 PM

## 2015-05-16 NOTE — Progress Notes (Addendum)
PROGRESS NOTE  Cole Cunningham [id number] DOB: [birth year] DOA: 05/15/2015 PCP: Antony Blackbird, MD Outpatient Specialists:      Brief Narrative: 82 y.o. male with medical history significant for CVA, Parkinson's disease, HTN, and COPD (doesn't take any medications at home) presented to the ED with right hip pain after he fell. Pain has become diffuse in lower back. All imaging was negative for acute injury. Admitted for observation. Needs PT evaluation.     Subjective: Appears dysarthric-susepct from a previous stroke, but is able to understand. Right leg rise elicited pain in lower back/hip. Denied pain when left leg was raised. Wife is also in the hospital at this time.   Assessment & Plan: Fall at home: Mechanical fall-denies syncope. Ambulates with the help of a walker at baseline, unfortunately unable to ambulate-currently awaiting PT evaluation.  Right hip xray and right hip CT scan negative for fracture.  Head CT was neg for acute intracranial abnormality.Lumbar spine X-ray showed no acute abnormality.Continue supportive care and await PT eval  Addendum 2:40 pm Seen by PT-recommendations are SNF-both patient and family want to go home-aware of fall risk. Will d/c home at their request. Patient has 24/7 care at home.  Elevated blood pressure without diagnoses of HTN. Suspect this is secondary to acute pain (it initially improved with pain control). Continue pain control. Start low dose amlodipine and monitor   Hx of colon cancer, s/p LAR 2013. Path c/w invasive moderately differentiated adenocarcinoma with invasion through muscularis propria into subserosal tissue. No evidence for metastatic disease. Margins negative, no tumor in 15 nodes.   History of prior CVA: Appears to have significant dysarthria-start low-dose aspirin.  Deconditioning/debility: Elderly male-appears very frail. Suspect very poor functional mobility at baseline-apparently walks with the help of a walker.  Await PT evaluation, may need SNF. Family at bedside.  DVT prophylaxis: Lovenox Code Status: Full Code Family Communication: None at bedside-attempted to call family-unable to leave message Disposition Plan: Home  Consultants:  None  Procedures:  None  Antimicrobials: None   Objective: Filed Vitals:   05/15/15 2211 05/16/15 0509 05/16/15 0947 05/16/15 1033  BP: 136/45 157/53    Pulse: 64 60    Temp: 98 F (36.7 C) 97.6 F (36.4 C)    TempSrc:      Resp: 17 16    Weight:   68 kg (149 lb 14.6 oz) 69.3 kg (152 lb 12.5 oz)  SpO2: 91% 95%      Intake/Output Summary (Last 24 hours) at 05/16/15 1043 Last data filed at 05/15/15 1221  Gross per 24 hour  Intake      0 ml  Output    140 ml  Net   -140 ml   Filed Weights   05/16/15 0947 05/16/15 1033  Weight: 68 kg (149 lb 14.6 oz) 69.3 kg (152 lb 12.5 oz)    Examination:  Filed Vitals:   05/15/15 2211 05/16/15 0509 05/16/15 0947 05/16/15 1033  BP: 136/45 157/53    Pulse: 64 60    Temp: 98 F (36.7 C) 97.6 F (36.4 C)    TempSrc:      Resp: 17 16    Weight:   68 kg (149 lb 14.6 oz) 69.3 kg (152 lb 12.5 oz)  SpO2: 91% 95%     Constitutional: NAD-appears frail and chronically sick appearing Eyes: PERRL Neck: normal, No JVD Respiratory: clear to auscultation bilaterally, no wheezing, no crackles. Normal respiratory effort. No accessory muscle use.  Musculoskeletal: Pain in back with  left leg rise.  Cardiovascular: Regular rate and rhythm, no murmurs / rubs / gallops. No extremity edema. Abdomen: Soft, non tender, non distended.  Skin: no rashes Neurologic: Dysarthric-non focal exam-but with gen weakness Psychiatric: Normal judgment and insight. Appears awake-alert and follows commands   Data Reviewed: I have personally reviewed following labs and imaging studies  CBC:  Recent Labs Lab 05/15/15 0828 05/16/15 0436  WBC 8.7 8.5  HGB 14.2 13.7  HCT 43.1 41.4  MCV 88.7 89.0  PLT 155 149*   Basic  Metabolic Panel:  Recent Labs Lab 05/15/15 0828 05/16/15 0436  NA 141 139  K 3.7 3.8  CL 106 106  CO2 27 24  GLUCOSE 101* 115*  BUN 16 18  CREATININE 0.66 0.65  CALCIUM 8.9 8.8*   Urine analysis:    Component Value Date/Time   COLORURINE YELLOW 05/15/2015 1220   APPEARANCEUR CLEAR 05/15/2015 1220   LABSPEC 1.026 05/15/2015 1220   PHURINE 6.0 05/15/2015 1220   GLUCOSEU NEGATIVE 05/15/2015 1220   HGBUR NEGATIVE 05/15/2015 1220   BILIRUBINUR NEGATIVE 05/15/2015 1220   KETONESUR NEGATIVE 05/15/2015 1220   PROTEINUR NEGATIVE 05/15/2015 1220   UROBILINOGEN 1.0 10/27/2014 1250   NITRITE NEGATIVE 05/15/2015 1220   LEUKOCYTESUR NEGATIVE 05/15/2015 1220   Radiology Studies: Dg Chest 2 View  05/15/2015  CLINICAL DATA:  Mid back and right hip pain secondary to a fall this morning. EXAM: CHEST  2 VIEW COMPARISON:  Chest x-ray and chest CT dated 10/25/2014 FINDINGS: There is chronic mild cardiomegaly. The pulmonary vascularity is normal. Slight scarring at the left lung base. The lungs are otherwise clear. Old compression fracture of L1. No acute bone abnormality. Calcification in the aortic arch. IMPRESSION: No active cardiopulmonary disease. Old compression fracture of L1. Aortic atherosclerosis. Electronically Signed   By: Lorriane Shire M.D.   On: 05/15/2015 08:57   Dg Lumbar Spine Complete  05/15/2015  CLINICAL DATA:  Severe bilateral low back pain after fall today. EXAM: LUMBAR SPINE - COMPLETE 4+ VIEW COMPARISON:  CT scan of January 02, 2012. FINDINGS: Diffuse osteopenia is noted. Stable wedge compression deformity of L1 vertebral body is noted and stable compared to prior exam consistent with old fracture. Atherosclerosis of abdominal aorta is noted. Disc spaces are well-maintained. No acute fracture or spondylolisthesis is noted. IMPRESSION: Old L1 compression fracture is noted. No acute abnormality seen in the lumbar spine. Electronically Signed   By: Marijo Conception, M.D.   On:  05/15/2015 18:42   Ct Head Wo Contrast  05/15/2015  CLINICAL DATA:  Fall, dizziness. EXAM: CT HEAD WITHOUT CONTRAST TECHNIQUE: Contiguous axial images were obtained from the base of the skull through the vertex without intravenous contrast. COMPARISON:  12/26/2008 FINDINGS: Bilateral basal ganglia lacunar infarcts. There is atrophy and chronic small vessel disease changes. No acute intracranial abnormality. Specifically, no hemorrhage, hydrocephalus, mass lesion, acute infarction, or significant intracranial injury. No acute calvarial abnormality. Visualized paranasal sinuses and mastoids clear. Orbital soft tissues unremarkable. IMPRESSION: Old bilateral basal ganglia lacunar infarcts. No acute intracranial abnormality. Atrophy, chronic microvascular disease. Electronically Signed   By: Rolm Baptise M.D.   On: 05/15/2015 11:24   Ct Hip Right Wo Contrast  05/15/2015  CLINICAL DATA:  Status post fall today. Right hip pain. History of prior fixation of a right intertrochanteric fracture. Initial encounter. EXAM: CT OF THE RIGHT HIP WITHOUT CONTRAST TECHNIQUE: Multidetector CT imaging of the right hip was performed according to the standard protocol. Multiplanar CT image reconstructions were  also generated. COMPARISON:  Plain films right hip 10/25/2014 and earlier today. CT chest, abdomen and pelvis 01/02/2012. FINDINGS: Dynamic hip screw is in place for fixation of remote remote intertrochanteric fracture. The fracture is healed. Hardware is intact without loosening or other complicating feature. No acute fracture is identified. The femoral head is located. Soft tissue about the right hip are unremarkable. Imaged intrapelvic contents demonstrate no focal abnormality. IMPRESSION: No acute abnormality. Remote healed right intertrochanteric fracture with fixation hardware in place. Electronically Signed   By: Inge Rise M.D.   On: 05/15/2015 15:53   Dg Hip Unilat With Pelvis 2-3 Views Right  05/15/2015   CLINICAL DATA:  Right hip pain secondary to a fall this morning. EXAM: DG HIP (WITH OR WITHOUT PELVIS) 2-3V RIGHT COMPARISON:  Radiographs dated 10/22/2014 FINDINGS: There is no evidence of hip fracture or dislocation. Compression screw and sideplate in the proximal right femur, unchanged. Proximal left femoral prosthesis appears unchanged. Osteopenia. Calcification in the abdominal aorta and iliac arteries. IMPRESSION: No acute bone abnormality.  Aortic atherosclerosis.  Osteopenia. Electronically Signed   By: Lorriane Shire M.D.   On: 05/15/2015 08:54     Scheduled Meds: . acetaminophen  1,000 mg Oral TID  . enoxaparin (LOVENOX) injection  40 mg Subcutaneous Q24H  . polyethylene glycol  17 g Oral Daily   Continuous Infusions:    Time spent: 35 minutes   [facility]  MD [facility] Pager [phone]  If 7PM-7AM, please contact night-coverage [email or web address] Password [facility] 05/16/2015, 10:43 AM

## 2015-05-16 NOTE — Discharge Summary (Signed)
PATIENT DETAILS Name: Cole Cunningham Age: 82 y.o. Sex: male Date of Birth: [birth year] MRN: [id number]. Admitting Physician: Waldemar Dickens, MD [id number], [name], MD  Admit Date: 05/15/2015 Discharge date: 05/16/2015  Recommendations for Outpatient Follow-up:  1. Being discharge home with home health services (patient/family refused SNF) 2. Please repeat CBC/BMET at next visit  PRIMARY DISCHARGE DIAGNOSIS:  Active Problems:   Fall   Elevated blood pressure   Low back pain      PAST MEDICAL HISTORY: Past Medical History  Diagnosis Date  . Stroke ([place])   . Hip fracture ([place])   . Parkinson's disease ([place])   . COPD (chronic obstructive pulmonary disease) ([place])   . Cancer [facility])     colon cancer  . Hyperlipidemia   . Anxiety   . Depression   . Hypertension     DISCHARGE MEDICATIONS: Current Discharge Medication List    START taking these medications   Details  acetaminophen (TYLENOL) 500 MG tablet Take 2 tablets (1,000 mg total) by mouth every 8 (eight) hours as needed. Qty: 30 tablet, Refills: 0    amLODipine (NORVASC) 5 MG tablet Take 1 tablet (5 mg total) by mouth daily. Qty: 30 tablet, Refills: 0    aspirin 81 MG chewable tablet Chew 1 tablet (81 mg total) by mouth daily. Qty: 30 tablet, Refills: 0    oxyCODONE (OXY IR/ROXICODONE) 5 MG immediate release tablet Take 1 tablet (5 mg total) by mouth every 6 (six) hours as needed for moderate pain or severe pain (take if tylenol is not working and still in severe pain). Qty: 15 tablet, Refills: 0      CONTINUE these medications which have CHANGED   Details  feeding supplement, ENSURE ENLIVE, (ENSURE ENLIVE) LIQD Take 237 mLs by mouth 2 (two) times daily between meals. Qty: 60 Bottle, Refills: 0      STOP taking these medications     aspirin EC 325 MG tablet      HYDROcodone-acetaminophen (NORCO) 5-325 MG tablet      methocarbamol (ROBAXIN) 500 MG tablet         ALLERGIES:  No Known  Allergies  BRIEF HPI:  See H&P, Labs, Consult and Test reports for all details in brief,82 y.o. male with medical history significant for CVA, Parkinson's disease, HTN, and COPD (doesn't take any medications at home) presented to the ED with right hip pain after he fell  CONSULTATIONS:   None  PERTINENT RADIOLOGIC STUDIES: Dg Chest 2 View  05/15/2015  CLINICAL DATA:  Mid back and right hip pain secondary to a fall this morning. EXAM: CHEST  2 VIEW COMPARISON:  Chest x-ray and chest CT dated 10/25/2014 FINDINGS: There is chronic mild cardiomegaly. The pulmonary vascularity is normal. Slight scarring at the left lung base. The lungs are otherwise clear. Old compression fracture of L1. No acute bone abnormality. Calcification in the aortic arch. IMPRESSION: No active cardiopulmonary disease. Old compression fracture of L1. Aortic atherosclerosis. Electronically Signed   By: Lorriane Shire M.D.   On: 05/15/2015 08:57   Dg Lumbar Spine Complete  05/15/2015  CLINICAL DATA:  Severe bilateral low back pain after fall today. EXAM: LUMBAR SPINE - COMPLETE 4+ VIEW COMPARISON:  CT scan of January 02, 2012. FINDINGS: Diffuse osteopenia is noted. Stable wedge compression deformity of L1 vertebral body is noted and stable compared to prior exam consistent with old fracture. Atherosclerosis of abdominal aorta is noted. Disc spaces are well-maintained. No acute fracture or spondylolisthesis is noted.  IMPRESSION: Old L1 compression fracture is noted. No acute abnormality seen in the lumbar spine. Electronically Signed   By: Marijo Conception, M.D.   On: 05/15/2015 18:42   Ct Head Wo Contrast  05/15/2015  CLINICAL DATA:  Fall, dizziness. EXAM: CT HEAD WITHOUT CONTRAST TECHNIQUE: Contiguous axial images were obtained from the base of the skull through the vertex without intravenous contrast. COMPARISON:  12/26/2008 FINDINGS: Bilateral basal ganglia lacunar infarcts. There is atrophy and chronic small vessel disease  changes. No acute intracranial abnormality. Specifically, no hemorrhage, hydrocephalus, mass lesion, acute infarction, or significant intracranial injury. No acute calvarial abnormality. Visualized paranasal sinuses and mastoids clear. Orbital soft tissues unremarkable. IMPRESSION: Old bilateral basal ganglia lacunar infarcts. No acute intracranial abnormality. Atrophy, chronic microvascular disease. Electronically Signed   By: Rolm Baptise M.D.   On: 05/15/2015 11:24   Ct Hip Right Wo Contrast  05/15/2015  CLINICAL DATA:  Status post fall today. Right hip pain. History of prior fixation of a right intertrochanteric fracture. Initial encounter. EXAM: CT OF THE RIGHT HIP WITHOUT CONTRAST TECHNIQUE: Multidetector CT imaging of the right hip was performed according to the standard protocol. Multiplanar CT image reconstructions were also generated. COMPARISON:  Plain films right hip 10/25/2014 and earlier today. CT chest, abdomen and pelvis 01/02/2012. FINDINGS: Dynamic hip screw is in place for fixation of remote remote intertrochanteric fracture. The fracture is healed. Hardware is intact without loosening or other complicating feature. No acute fracture is identified. The femoral head is located. Soft tissue about the right hip are unremarkable. Imaged intrapelvic contents demonstrate no focal abnormality. IMPRESSION: No acute abnormality. Remote healed right intertrochanteric fracture with fixation hardware in place. Electronically Signed   By: Inge Rise M.D.   On: 05/15/2015 15:53   Dg Hip Unilat With Pelvis 2-3 Views Right  05/15/2015  CLINICAL DATA:  Right hip pain secondary to a fall this morning. EXAM: DG HIP (WITH OR WITHOUT PELVIS) 2-3V RIGHT COMPARISON:  Radiographs dated 10/22/2014 FINDINGS: There is no evidence of hip fracture or dislocation. Compression screw and sideplate in the proximal right femur, unchanged. Proximal left femoral prosthesis appears unchanged. Osteopenia. Calcification in  the abdominal aorta and iliac arteries. IMPRESSION: No acute bone abnormality.  Aortic atherosclerosis.  Osteopenia. Electronically Signed   By: Lorriane Shire M.D.   On: 05/15/2015 08:54     PERTINENT LAB RESULTS: CBC:  Recent Labs  05/15/15 0828 05/16/15 0436  WBC 8.7 8.5  HGB 14.2 13.7  HCT 43.1 41.4  PLT 155 149*   CMET CMP     Component Value Date/Time   NA 139 05/16/2015 0436   K 3.8 05/16/2015 0436   CL 106 05/16/2015 0436   CO2 24 05/16/2015 0436   GLUCOSE 115* 05/16/2015 0436   BUN 18 05/16/2015 0436   CREATININE 0.65 05/16/2015 0436   CALCIUM 8.8* 05/16/2015 0436   PROT 5.3* 12/31/2011 0555   ALBUMIN 2.6* 12/31/2011 0555   AST 21 12/31/2011 0555   ALT 10 12/31/2011 0555   ALKPHOS 98 12/31/2011 0555   BILITOT 2.9* 12/31/2011 0555   GFRNONAA >60 05/16/2015 0436   GFRAA >60 05/16/2015 0436    GFR CrCl cannot be calculated (Unknown ideal weight.). No results for input(s): LIPASE, AMYLASE in the last 72 hours. No results for input(s): CKTOTAL, CKMB, CKMBINDEX, TROPONINI in the last 72 hours. Invalid input(s): POCBNP No results for input(s): DDIMER in the last 72 hours. No results for input(s): HGBA1C in the last 72 hours. No results  for input(s): CHOL, HDL, LDLCALC, TRIG, CHOLHDL, LDLDIRECT in the last 72 hours. No results for input(s): TSH, T4TOTAL, T3FREE, THYROIDAB in the last 72 hours.  Invalid input(s): FREET3 No results for input(s): VITAMINB12, FOLATE, FERRITIN, TIBC, IRON, RETICCTPCT in the last 72 hours. Coags: No results for input(s): INR in the last 72 hours.  Invalid input(s): PT Microbiology: Recent Results (from the past 240 hour(s))  Urine culture     Status: None   Collection Time: 05/15/15 12:20 PM  Result Value Ref Range Status   Specimen Description URINE, RANDOM  Final   Special Requests NONE  Final   Culture NO GROWTH 1 DAY  Final   Report Status 05/16/2015 FINAL  Final     BRIEF HOSPITAL COURSE:  Fall at home: Mechanical  fall-denies syncope. Ambulates with the help of a walker at baseline, unfortunately unable to ambulate following fall due to pain and hence admitted. Underwent PT evaluation-recommendations were for SNF-however patient and family prefer he go home, they have 24/7 care at home.Per patient pain is somewhat better-would continue tylenol prn and use prn oxycodone if ONLY pain is not relieved by tylenol. Spoke with patient's daughter-tel [phone] over the phone-she was agreeable with this plan.Note- Right hip xray and right hip CT scan negative for fracture. Head CT was neg for acute intracranial abnormality.Lumbar spine X-ray showed no acute abnormality.  Elevated blood pressure without diagnoses of HTN. Suspect this is secondary to acute pain (it initially improved with pain control). Continue pain and low dose amlodipine-if BP normalized over the next few weeks-consider discontinuing amlodipine.   Hx of colon cancer, s/p LAR 2013. Path c/w invasive moderately differentiated adenocarcinoma with invasion through muscularis propria into subserosal tissue. No evidence for metastatic disease. Margins negative, no tumor in 15 nodes.   History of prior CVA: Appears to have significant dysarthria-continue low-dose aspirin.  Deconditioning/debility: Elderly male-appears very frail. Suspect very poor functional mobility at baseline-apparently walks with the help of a walker. Seen by PT-recommendations were for SNF-but patient and family want to go home with home health services.  TODAY-DAY OF DISCHARGE:  Subjective:   Hulbert Strub today has less pain compared to yesterday-right hip area still "sore"  Objective:   Blood pressure 147/55, pulse 60, temperature 97.6 F (36.4 C), temperature source Oral, resp. rate 16, weight 69.3 kg (152 lb 12.5 oz), SpO2 95 %. No intake or output data in the 24 hours ending 05/16/15 1445 Filed Weights   05/16/15 0947 05/16/15 1033  Weight: 68 kg (149 lb 14.6  oz) 69.3 kg (152 lb 12.5 oz)    Exam Awake Alert, Follows commands-dysarthric at baseline.  [place].AT,PERRAL Supple Neck,No JVD, No cervical lymphadenopathy appriciated.  Symmetrical Chest wall movement, Good air movement bilaterally, CTAB RRR,No Gallops,Rubs or new Murmurs, No Parasternal Heave +ve B.Sounds, Abd Soft, Non tender, No organomegaly appriciated, No rebound -guarding or rigidity. No Cyanosis, Clubbing or edema, No new Rash or bruise  DISCHARGE CONDITION: Stable  DISPOSITION: Home with home health services  DISCHARGE INSTRUCTIONS:    Activity:  As tolerated with Full fall precautions use walker/cane & assistance as needed  Get Medicines reviewed and adjusted: Please take all your medications with you for your next visit with your Primary MD  Please request your Primary MD to go over all hospital tests and procedure/radiological results at the follow up, please ask your Primary MD to get all Hospital records sent to his/her office.  If you experience worsening of your admission symptoms, develop shortness of  breath, life threatening emergency, suicidal or homicidal thoughts you must seek medical attention immediately by calling 911 or calling your MD immediately  if symptoms less severe.  You must read complete instructions/literature along with all the possible adverse reactions/side effects for all the Medicines you take and that have been prescribed to you. Take any new Medicines after you have completely understood and accpet all the possible adverse reactions/side effects.   Do not drive when taking Pain medications.   Do not take more than prescribed Pain, Sleep and Anxiety Medications  Special Instructions: If you have smoked or chewed Tobacco  in the last 2 yrs please stop smoking, stop any regular Alcohol  and or any Recreational drug use.  Wear Seat belts while driving.  Please note  You were cared for by a hospitalist during your hospital stay. Once you are  discharged, your primary care physician will handle any further medical issues. Please note that NO REFILLS for any discharge medications will be authorized once you are discharged, as it is imperative that you return to your primary care physician (or establish a relationship with a primary care physician if you do not have one) for your aftercare needs so that they can reassess your need for medications and monitor your lab values.   Diet recommendation: Heart Healthy diet  Discharge Instructions    Call MD for:  extreme fatigue    Complete by:  As directed      Call MD for:  severe uncontrolled pain    Complete by:  As directed      Diet - low sodium heart healthy    Complete by:  As directed      Increase activity slowly    Complete by:  As directed            Follow-up Information    Follow up with [facility]. Schedule an appointment as soon as possible for a visit in 1 week.   Specialty:  Emergency Medicine   Why:  As needed, If symptoms worsen, Hospital follow up   Contact information:   [id number] [place] [phone]      Follow up with Antony Blackbird, MD.   Specialty:  Family Medicine   Contact information:   [phone] [address] [place] [address] [phone]       Total Time spent on discharge equals 25 minutes.  SignedOren Binet 05/16/2015 2:45 PM

## 2015-05-16 NOTE — Progress Notes (Addendum)
Pt came to floor with BP of 213/80, MD notified. Hydralazine given, BP was 169/47 after the administration of Hydralazine. Will continue to monitor.  Pt was moved from the original room 36 to room 14, a camera room to closely monitor pt because he is a high fall risk and sometimes confused. Pt has scheduled Oxycodone due, but was asleep at this time. Pt was awoken and asked if he was in pain, pt said no.  Pt is awake. He's had tylenol earlier.Pain uncontrolled by Oxycodone given at 3am. Another due at 6am, but in pain at around 4:05am. MD notified. Tylenol given and warm pad applied. Will continue to monitor.

## 2015-05-16 NOTE — Care Management Obs Status (Signed)
[place] NOTIFICATION   Patient Details  Name: HER GERHARDT MRN: [id number] Date of Birth: 1932/07/14   Medicare Observation Status Notification Given:  Yes    Carles Collet, RN 05/16/2015, 3:08 PM

## 2015-05-16 NOTE — Clinical Social Work Note (Signed)
Clinical Social Work Assessment  Patient Details  Name: Cole Cunningham MRN: [id number] Date of Birth: 1932-05-23  Date of referral:  05/16/15               Reason for consult:  Discharge Planning                Permission sought to share information with:  Case Manager, Facility Sport and exercise psychologist, Family Supports Permission granted to share information::  Yes, Verbal Permission Granted  Name::     Cole Cunningham  Agency::     Relationship::   (spouse)  Sport and exercise psychologist Information:   [phone])  Housing/Transportation Living arrangements for the past 2 months:  Single Family Home Source of Information:  Patient Patient Interpreter Needed:  None Criminal Activity/Legal Involvement Pertinent to Current Situation/Hospitalization:  No - Comment as needed Significant Relationships:  Adult Children, Spouse Lives with:  Adult Children, Spouse Do you feel safe going back to the place where you live?  Yes Need for family participation in patient care:  Yes (Comment)  Care giving concerns:  Patient slipped and fell at home.  PT recommend SNF.   Social Worker assessment / plan:  BSW intern enters patient room, patient is alert and oriented with daughter at bedside. BSW intern introduce herself and PT recommendation. BSW intern also explained referral process. Patient and patient daughter booth agreed on home health considering the patient oldest son lives with them he is often home to help and support them. The daughter states that her and her other brother also help in assisting  mom and dad. Patient fells as thought he would be more comfortable at home rather than a facility. Patient daughter mention he has received home health in the past as well as been to a facility and that considering her mom is in the hospital too then home health could possibly be considered for her as well.   Employment status:  Retired Nurse, adult PT Recommendations:  [place] / Referral to community resources:  Acute Rehab  Patient/Family's Response to care: patient has a good response to care.   Patient/Family's Understanding of and Emotional Response to Diagnosis, Current Treatment, and Prognosis:  Patient and patient daughter has a good insight on patient condition and current treatment plan.   Emotional Assessment Appearance:  Appears older than stated age Attitude/Demeanor/Rapport:   (welcoming) Affect (typically observed):  Accepting Orientation:  Oriented to Self, Oriented to Place, Oriented to  Time, Oriented to Situation Alcohol / Substance use:    Psych involvement (Current and /or in the community):  No (Comment)  Discharge Needs  Concerns to be addressed:  No discharge needs identified Readmission within the last 30 days:  No Current discharge risk:  None Barriers to Discharge:  No Barriers Identified   [place] intern  [phone]  CSW reviewed above assessment and agrees with its findings  Domenica Reamer, [place] Worker [phone]

## 2015-05-16 NOTE — Evaluation (Signed)
Physical Therapy Evaluation Patient Details Name: Cole Cunningham MRN: [id number] DOB: 01/18/1933 Today's Date: 05/16/2015   History of Present Illness  Patient is a 82 y/o male with hx of bil hip fx's, CVA, PD, anxiety, depression, COPD and colon ca presents s/p fall at home and back pain. All scans negative.   Clinical Impression  Patient presents with decreased initiation, weakness, impaired balance and low toned speech. Requires Mod A to stand from elevated surfaces and Min A to take a few steps to the chair. Pt with fall history. Sounds like pt's wife is in the hospital per his report. Would benefit from ST SNF to maximize independence and mobility and decrease fall risk prior to return home with support from son.    Follow Up Recommendations SNF    Equipment Recommendations  None recommended by PT    Recommendations for Other Services       Precautions / Restrictions Precautions Precautions: Fall Restrictions Weight Bearing Restrictions: No      Mobility  Bed Mobility Overal bed mobility: Needs Assistance Bed Mobility: Supine to Sit     Supine to sit: Mod assist;HOB elevated     General bed mobility comments: Difficulty initiating movement to get to EOB. Assist with LEs and trunk.   Transfers Overall transfer level: Needs assistance Equipment used: Rolling walker (2 wheeled) Transfers: Sit to/from Stand Sit to Stand: Mod assist         General transfer comment: Assist to boost from EOB with cues for hand placement/technique. Stiffness noted.  Ambulation/Gait Ambulation/Gait assistance: Min assist Ambulation Distance (Feet): 4 Feet Assistive device: Rolling walker (2 wheeled) Gait Pattern/deviations: Trunk flexed;Step-to pattern;Shuffle Gait velocity: decreased   General Gait Details: Able to take a few steps to get to chair. Increased time. Assist with RW management.  Stairs            Wheelchair Mobility    Modified Rankin (Stroke  Patients Only)       Balance Overall balance assessment: Needs assistance;History of Falls Sitting-balance support: Feet supported;No upper extremity supported Sitting balance-Leahy Scale: Fair Sitting balance - Comments: Initially requires Min A for static sitting balance but after repositioning, able to sit with Min guard assist.  Postural control: Posterior lean Standing balance support: During functional activity Standing balance-Leahy Scale: Poor Standing balance comment: Reliant on BUes for support.                              Pertinent Vitals/Pain Pain Assessment: Faces Faces Pain Scale: No hurt    Home Living Family/patient expects to be discharged to:: Skilled nursing facility                      Prior Function Level of Independence: Needs assistance   Gait / Transfers Assistance Needed: Reports using RW for ambulation, falls.  ADL's / Homemaking Assistance Needed: Reports assist with ADLs from son at baseline.         Hand Dominance   Dominant Hand: Right    Extremity/Trunk Assessment   Upper Extremity Assessment: Defer to OT evaluation           Lower Extremity Assessment: Generalized weakness      Cervical / Trunk Assessment: Kyphotic  Communication   Communication: Expressive difficulties (low toned.)  Cognition Arousal/Alertness: Awake/alert Behavior During Therapy: Flat affect Overall Cognitive Status: Difficult to assess (A &O x3.)  General Comments General comments (skin integrity, edema, etc.): Per chart, pt's wife is in the hospital.    Exercises        Assessment/Plan    PT Assessment Patient needs continued PT services  PT Diagnosis Difficulty walking;Abnormality of gait   PT Problem List Decreased strength;Decreased cognition;Decreased activity tolerance;Decreased mobility;Decreased balance;Decreased knowledge of use of DME  PT Treatment Interventions Balance training;Gait  training;Functional mobility training;Therapeutic activities;Therapeutic exercise;Patient/family education;DME instruction   PT Goals (Current goals can be found in the Care Plan section) Acute Rehab PT Goals Patient Stated Goal: none stated PT Goal Formulation: With patient Time For Goal Achievement: 05/30/15 Potential to Achieve Goals: Fair    Frequency Min 2X/week   Barriers to discharge Decreased caregiver support      Co-evaluation               End of Session Equipment Utilized During Treatment: Gait belt Activity Tolerance: Patient tolerated treatment well Patient left: in chair;with call bell/phone within reach;with chair alarm set Nurse Communication: Mobility status;Other (comment) (tx technique)    Functional Assessment Tool Used: clinical judgment Functional Limitation: Mobility: Walking and moving around Mobility: Walking and Moving Around Current Status [phone]): At least 40 percent but less than 60 percent impaired, limited or restricted Mobility: Walking and Moving Around Goal Status [phone]): At least 20 percent but less than 40 percent impaired, limited or restricted    Time: 1147-1207 PT Time Calculation (min) (ACUTE ONLY): 20 min   Charges:   PT Evaluation $PT Eval Moderate Complexity: 1 Procedure     PT G Codes:   PT G-Codes **NOT FOR INPATIENT CLASS** Functional Assessment Tool Used: clinical judgment Functional Limitation: Mobility: Walking and moving around Mobility: Walking and Moving Around Current Status [phone]): At least 40 percent but less than 60 percent impaired, limited or restricted Mobility: Walking and Moving Around Goal Status [phone]): At least 20 percent but less than 40 percent impaired, limited or restricted    [place] 05/16/2015, 1:49 PM Wray Kearns, [place], DPT [phone]

## 2015-05-16 NOTE — Progress Notes (Signed)
Cederic S Punch to be D/C'd to home with home health per MD order.  Discussed with the patient and daughter and all questions fully answered.  VSS, Skin clean, dry and intact without evidence of skin break down, no evidence of skin tears noted. IV catheter discontinued intact. Site without signs and symptoms of complications. Dressing and pressure applied.  An After Visit Summary was printed and given to the patient. Patient's daughter received prescriptions.  D/c education completed with patient/family including follow up instructions, medication list, d/c activities limitations if indicated, with other d/c instructions as indicated by MD - patient able to verbalize understanding, all questions fully answered.   Patient instructed to return to ED, call 911, or call MD for any changes in condition.   Patient escorted via [place], and D/C home via private auto.  Lynann Beaver 05/16/2015 6:43 PM

## 2016-04-26 ENCOUNTER — Emergency Department (HOSPITAL_COMMUNITY): Payer: Medicare Other

## 2016-04-26 ENCOUNTER — Encounter (HOSPITAL_COMMUNITY): Payer: Self-pay | Admitting: Emergency Medicine

## 2016-04-26 ENCOUNTER — Emergency Department (HOSPITAL_COMMUNITY)
Admission: EM | Admit: 2016-04-26 | Discharge: 2016-05-19 | Disposition: E | Payer: Medicare Other | Attending: Emergency Medicine | Admitting: Emergency Medicine

## 2016-04-26 DIAGNOSIS — Z7982 Long term (current) use of aspirin: Secondary | ICD-10-CM | POA: Insufficient documentation

## 2016-04-26 DIAGNOSIS — Z85038 Personal history of other malignant neoplasm of large intestine: Secondary | ICD-10-CM | POA: Diagnosis not present

## 2016-04-26 DIAGNOSIS — J449 Chronic obstructive pulmonary disease, unspecified: Secondary | ICD-10-CM | POA: Insufficient documentation

## 2016-04-26 DIAGNOSIS — I10 Essential (primary) hypertension: Secondary | ICD-10-CM | POA: Insufficient documentation

## 2016-04-26 DIAGNOSIS — Z87891 Personal history of nicotine dependence: Secondary | ICD-10-CM | POA: Insufficient documentation

## 2016-04-26 DIAGNOSIS — I469 Cardiac arrest, cause unspecified: Secondary | ICD-10-CM | POA: Insufficient documentation

## 2016-04-26 DIAGNOSIS — G20 Parkinson's disease: Secondary | ICD-10-CM | POA: Insufficient documentation

## 2016-05-19 NOTE — ED Triage Notes (Signed)
Per EMS: pt from SNF with unresponsive and agonal respirations upon arrival; pt became CPR in route

## 2016-05-19 NOTE — Progress Notes (Signed)
Patient arrived via EMS intubated.  Per MD, ETT removed due to not being a medical examiner case.

## 2016-05-19 NOTE — ED Triage Notes (Signed)
Pt given IM versed by EMS for intubation and was intubated with 7.0 ET tube by EMS

## 2016-05-19 NOTE — Progress Notes (Addendum)
Chaplain responded to CPR call that came in moments before shift change.  Family was gathered in consult room and had just been informed of death by MD.  Cole Cunningham provided emotional and grief support, escorted family to view body and provided brief orientation to process.  Three sons and various family members viewed body; one daughter, Cole Cunningham, is out of town and will not come to hospital.  Chaplain communicated funeral home information [employer], [address] [place]) to [employer] and worked with family to ascertain whether other family would visit.  With RN, set outside time of 11:30 AM for any remaining family visitation and alerted staff that family will contact chaplain if one last granddaughter will come or not.  Please contact as needed.  Cole Cunningham [phone]  Escorted last family members to view body.   10:35 AM     05/16/16 0900  Clinical Encounter Type  Visited With Family  Visit Type Initial;Psychological support;Spiritual support;Death  Referral From Physician  Consult/Referral To Chaplain  Spiritual Encounters  Spiritual Needs Grief support  Stress Factors  Family Stress Factors Family relationships;Major life changes

## 2016-05-19 NOTE — ED Provider Notes (Signed)
[facility] DEPT Provider Note   CSN: [id number] Arrival date & time: 05-02-2016  0806     History   Chief Complaint Chief Complaint  Patient presents with  . Cardiac Arrest    HPI Cole Cunningham is a 83 y.o. male.  HPI  Patient presents with extreme is from his nursing facility. EMS reports that the patient was last seen normal about 6 hours prior to 911 call. The patient himself is actively receiving CPR, is unresponsive, Level 5 caveat secondary to clinical condition.  EMS reports the patient has multiple medical issues including history of cancer. Nursing home report did not include additional details.    Past Medical History:  Diagnosis Date  . Anxiety   . Cancer [facility])    colon cancer  . COPD (chronic obstructive pulmonary disease) ([place])   . Depression   . Hip fracture ([place])   . Hyperlipidemia   . Hypertension   . Parkinson's disease ([place])   . Stroke [facility])     Patient Active Problem List   Diagnosis Date Noted  . Fall 05/15/2015  . Elevated blood pressure 05/15/2015  . Low back pain 05/15/2015  . Right hip pain   . Physical deconditioning   . Essential hypertension   . Pressure ulcer 10/26/2014  . Hypoxia 10/25/2014  . Left hip pain 10/25/2014  . Closed left hip fracture ([place]) 10/25/2014  . Adenocarcinoma of colon ([place]) 01/06/2012  . Rectal mass 01/05/2012  . Leukocytosis 01/02/2012  . Anemia 12/30/2011  . GI bleed 12/30/2011  . Parkinson's disease ([place]) 12/30/2011  . Hypokalemia 12/30/2011    Past Surgical History:  Procedure Laterality Date  . BOWEL RESECTION  01/05/2012   Procedure: LOW ANTERIOR BOWEL RESECTION;  Surgeon: Edward Jolly, MD;  Location: [facility];  Service: General;  Laterality: N/A;  . ESOPHAGOGASTRODUODENOSCOPY  01/01/2012   Procedure: ESOPHAGOGASTRODUODENOSCOPY (EGD);  Surgeon: Winfield Cunas., MD;  Location: [facility] ENDOSCOPY;  Service: Endoscopy;  Laterality: Left;  . EYE SURGERY    . FLEXIBLE SIGMOIDOSCOPY   01/02/2012   Procedure: FLEXIBLE SIGMOIDOSCOPY;  Surgeon: Missy Sabins, MD;  Location: [facility];  Service: Endoscopy;  Laterality: N/A;  . FRACTURE SURGERY    . HERNIA REPAIR         Home Medications    Prior to Admission medications   Medication Sig Start Date End Date Taking? Authorizing Provider  acetaminophen (TYLENOL) 500 MG tablet Take 2 tablets (1,000 mg total) by mouth every 8 (eight) hours as needed. 05/16/15   Shanker Kristeen Mans, MD  amLODipine (NORVASC) 5 MG tablet Take 1 tablet (5 mg total) by mouth daily. 05/16/15   Shanker Kristeen Mans, MD  aspirin 81 MG chewable tablet Chew 1 tablet (81 mg total) by mouth daily. 05/16/15   Shanker Kristeen Mans, MD  feeding supplement, ENSURE ENLIVE, (ENSURE ENLIVE) LIQD Take 237 mLs by mouth 2 (two) times daily between meals. 05/16/15   Shanker Kristeen Mans, MD  oxyCODONE (OXY IR/ROXICODONE) 5 MG immediate release tablet Take 1 tablet (5 mg total) by mouth every 6 (six) hours as needed for moderate pain or severe pain (take if tylenol is not working and still in severe pain). 05/16/15   Shanker Kristeen Mans, MD    Family History Family History  Problem Relation Age of Onset  . Other Neg Hx     Social History Social History  Substance Use Topics  . Smoking status: Former Research scientist (life sciences)  . Smokeless tobacco: Not on file  .  Alcohol use No     Allergies   Patient has no known allergies.   Review of Systems Review of Systems  Unable to perform ROS: Acuity of condition     Physical Exam Updated Vital Signs BP (!) 0/0   Pulse (!) 0   Resp (!) 0   Wt 152 lb (68.9 kg)   BMI 26.09 kg/m   Physical Exam  Constitutional: He has a sickly appearance. He appears distressed.  Frail appearing elderly male actively receiving CPR, unresponsive, intubation tube in place.  HENT:  Head: Normocephalic and atraumatic.  Neck: No thyromegaly present.  Cardiovascular:  Pulses only appreciable with active CPR, otherwise no appreciable pulses    Pulmonary/Chest:  Breath sounds audible in both sides with assisted ventilation  Abdominal: He exhibits no distension.  Musculoskeletal: He exhibits no deformity.  Neurological:  unresponsive  Skin: Skin is warm and dry.  Psychiatric:  unresponsive  Nursing note and vitals reviewed.    ED Treatments / Results   EKG  EKG Interpretation  Date/Time:  05-26-16 08:19:55 EDT Ventricular Rate:  0 PR Interval:    QRS Duration:   QT Interval:    QTC Calculation:   R Axis:   0 Text Interpretation:  asystole. Abnormal ekg Confirmed by Carmin Muskrat  MD [phone]) on 2016-05-26 9:03:04 AM       Procedures Procedures (including critical care time)   Immediately after arrival here the patient was transferred to our monitoring equipment, continue to receive CPR via ACLS protocol. Initial rhythm check did not demonstrate palpable pulses in his extremities.  Cardiopulmonary Resuscitation (CPR) Procedure Note Directed/Performed by: Carmin Muskrat I personally directed ancillary staff and/or performed CPR in an effort to regain return of spontaneous circulation and to maintain cardiac, neuro and systemic perfusion.   Patient received additional rounds of CPR as we obtained appropriate initial information. Patient required IV access via placement of an intraosseous line in the left tibial surface.   IO insertion Performed by: Carmin Muskrat  Consent:implied - patient unresponsive  Time out: Immediately prior to procedure a "time out" was called to verify the correct patient, procedure, equipment, support staff and site/side marked as required.  Preparation: Patient was prepped and draped in the usual sterile fashion.  IO placement in L tibia  After flush, appropriate flow appreciable.  Normal blood return and flush without difficulty Patient tolerance: Patient tolerated the procedure well with no immediate complications.     EMERGENCY DEPARTMENT [place] CARDIAC  EXAM "Study: Limited Ultrasound of the Heart and Pericardium"  INDICATIONS:Cardiac arrest Multiple views of the heart and pericardium were obtained in real-time with a multi-frequency probe.  PERFORMED RU:EAVWUJ IMAGES ARCHIVED?: No LIMITATIONS:  Emergent procedure VIEWS USED: Parasternal long axis and Parasternal short axis INTERPRETATION: Cardiac activity absent and Pericardial effusioin absent    After CPR was performed, for several cycles, with no evidence for return of spontaneous circulation, no ultrasound evidence for ongoing cardiac activity, no neurologic evidence for spontaneous activity, the patient was prepped.at [place]  Subsequently I discussed the patient's situation with his family members.  On the patient's son notes that the patient has had decline in functionality, in particular of the past week, with anorexia, weakness, diminished interactivity.     Initial Impression / Assessment and Plan / ED Course  I have reviewed the triage vital signs and the nursing notes.  Pertinent labs & imaging results that were available during my care of the patient were reviewed by me and  considered in my medical decision making (see chart for details).    Final Clinical Impressions(s) / ED Diagnoses   Final diagnoses:  Cardiac arrest [facility])     Carmin Muskrat, MD 05-21-16 1031

## 2016-05-19 DEATH — deceased

## 2016-06-29 IMAGING — CR DG LUMBAR SPINE COMPLETE 4+V
5 series · 5 of 5 positions shown · non-contrast
Comparison: CT scan of January 02, 2012.

CLINICAL DATA: Severe bilateral low back pain after fall today.

EXAM:
LUMBAR SPINE - COMPLETE 4+ VIEW

[l-spine ap]
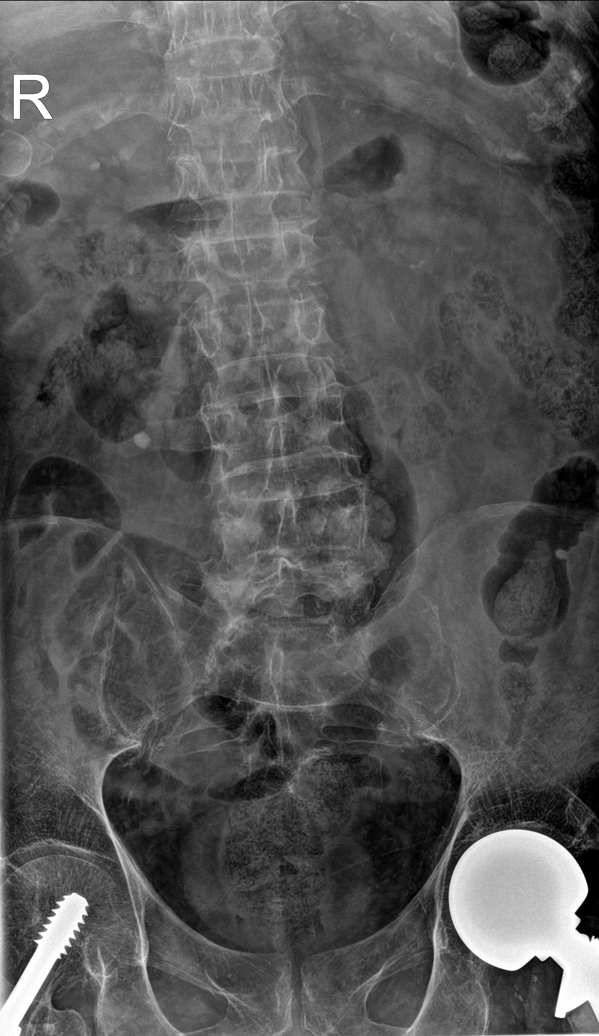

[l-spine obl (1 of 2)]
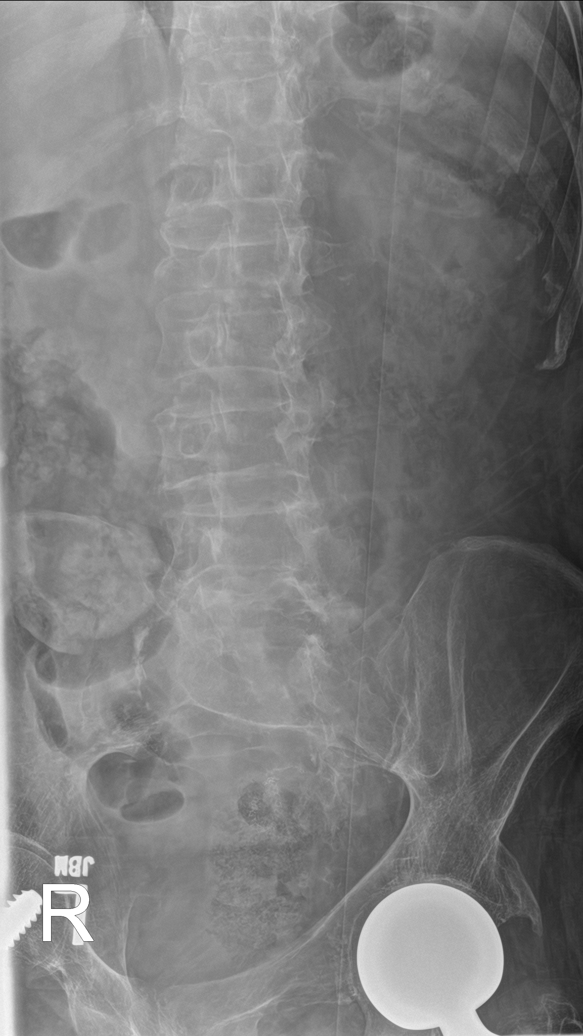

[l-spine obl (2 of 2)]
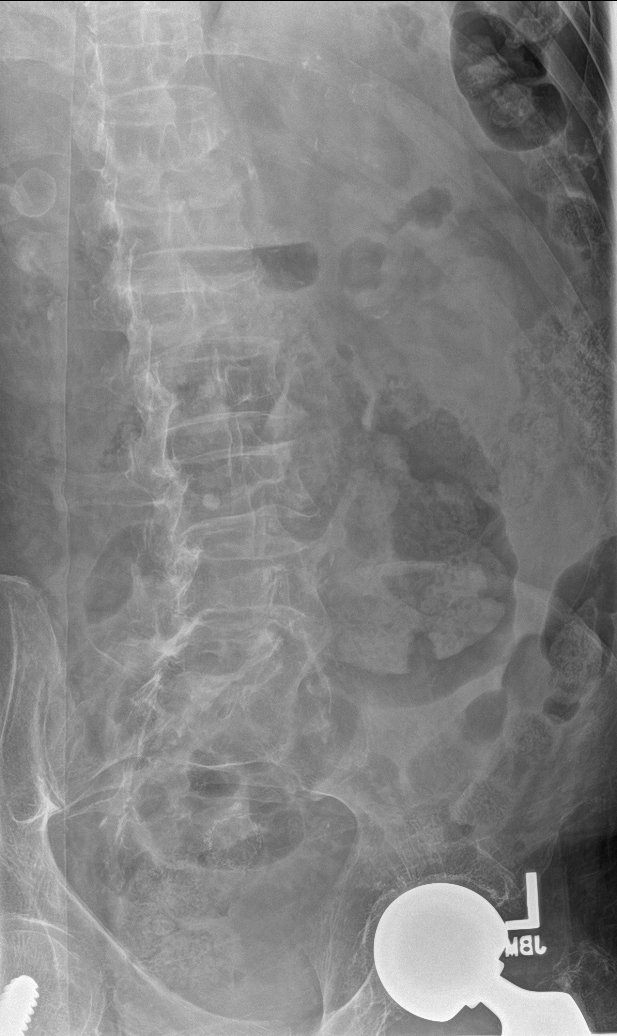

[l-spine lat]
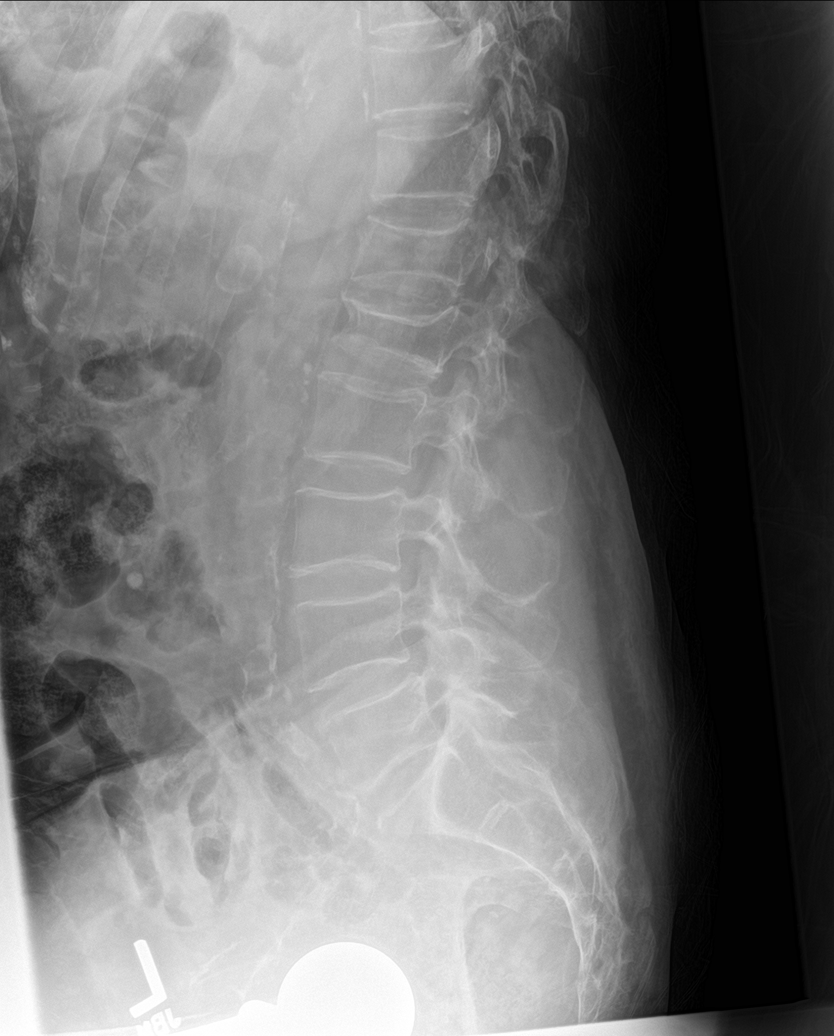

[l-spine spot]
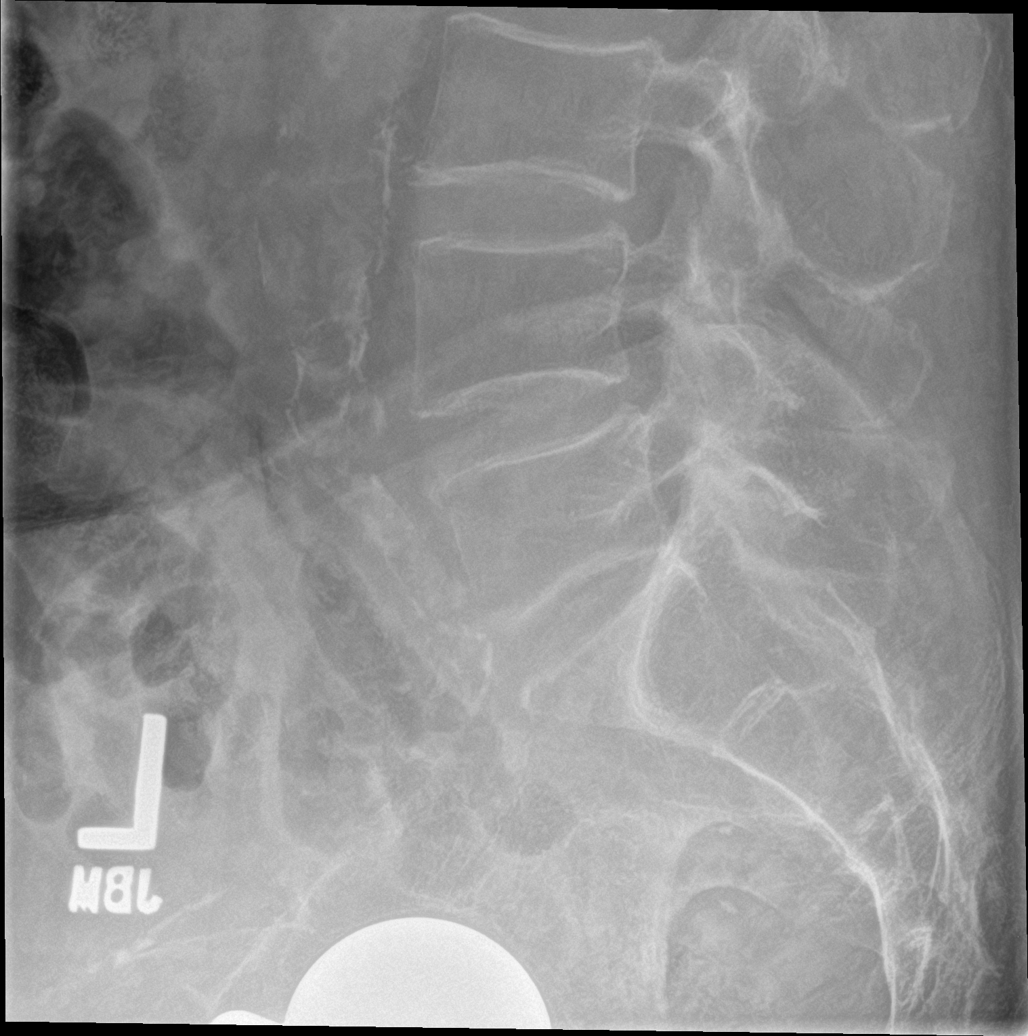

[5 of 5 positions shown; findings below may reference images not displayed]

FINDINGS: Diffuse osteopenia is noted. Stable wedge compression deformity of
L1 vertebral body is noted and stable compared to prior exam
consistent with old fracture. Atherosclerosis of abdominal aorta is
noted. Disc spaces are well-maintained. No acute fracture or
spondylolisthesis is noted.
IMPRESSION: Old L1 compression fracture is noted. No acute abnormality seen in
the lumbar spine.

## 2016-06-29 IMAGING — CT CT HEAD W/O CM
2 series · 15 of 30 positions shown, 17 images · non-contrast
Comparison: 12/26/2008

CLINICAL DATA: Fall, dizziness.

EXAM:
CT HEAD WITHOUT CONTRAST
TECHNIQUE: Contiguous axial images were obtained from the base of the skull
through the vertex without intravenous contrast.

[Series 2: head bone · axial · 0.43mm/px · z∈[+1124,+1246]mm · 8 of 77 slices shown]
[im 8/77  bone]
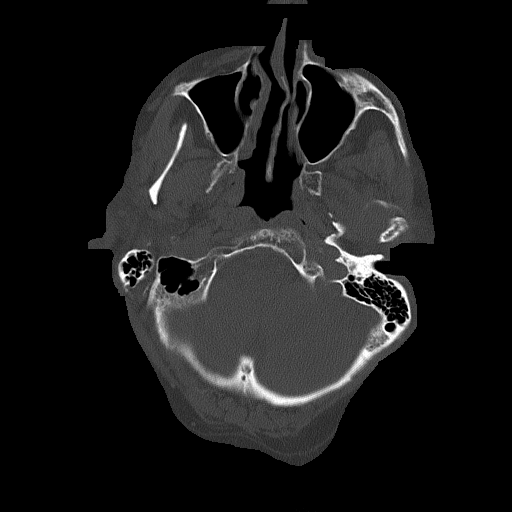
[im 16/77  bone]
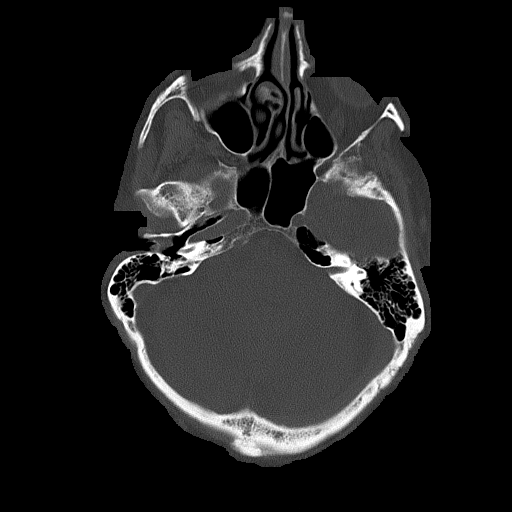
[im 23/77  bone]
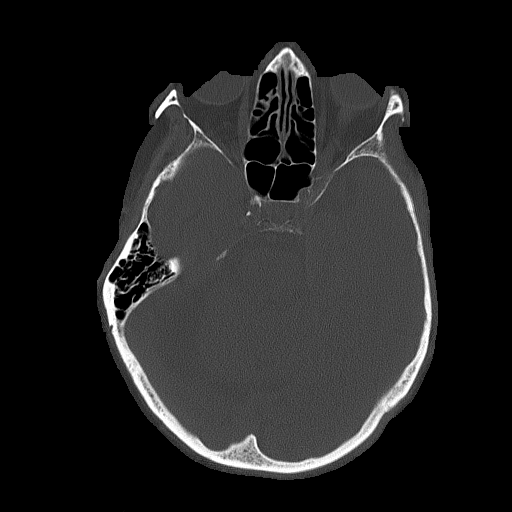
[im 35/77  bone]
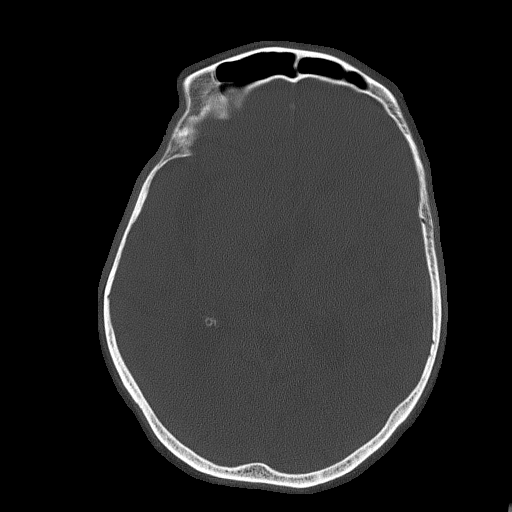
[im 42/77  bone]
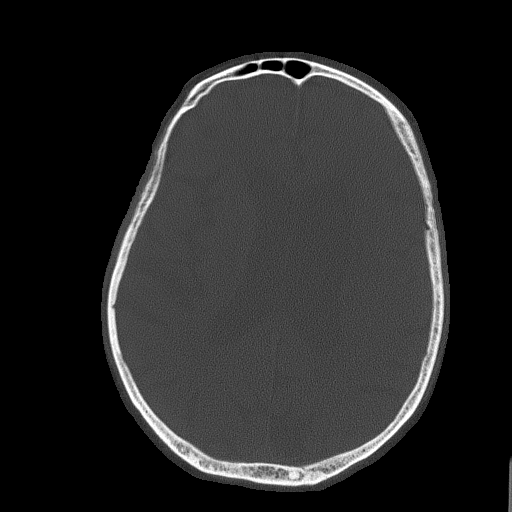
[im 54/77  bone]
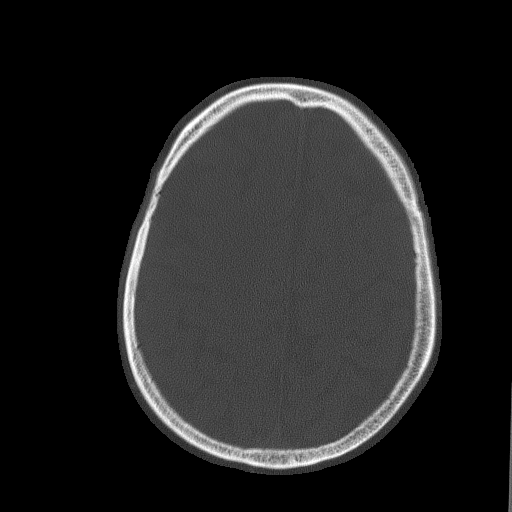
[im 61/77  bone]
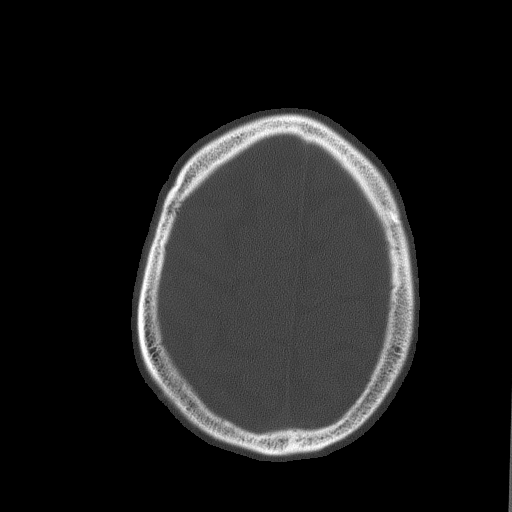
[im 69/77  bone]
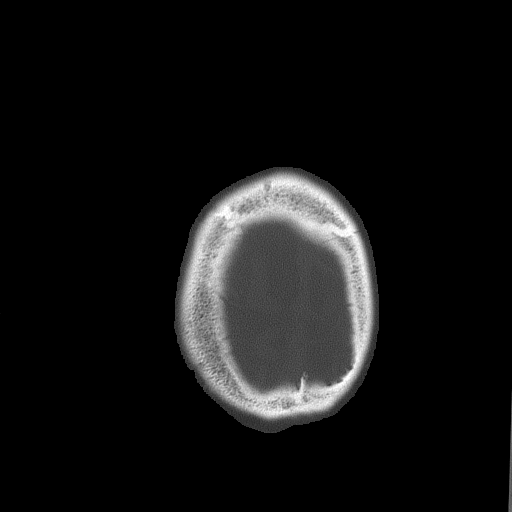

[Series 3: head without · axial · non-contrast · 0.43mm/px · z∈[+1125,+1240]mm · 7 of 31 slices shown, 9 images]
[im 4/31  brain]
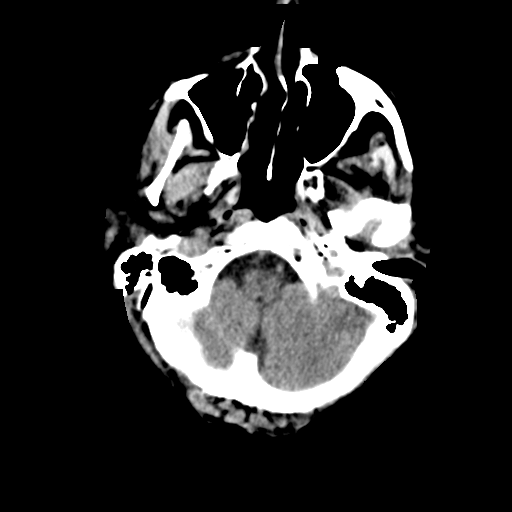
[im 4/31  bone]
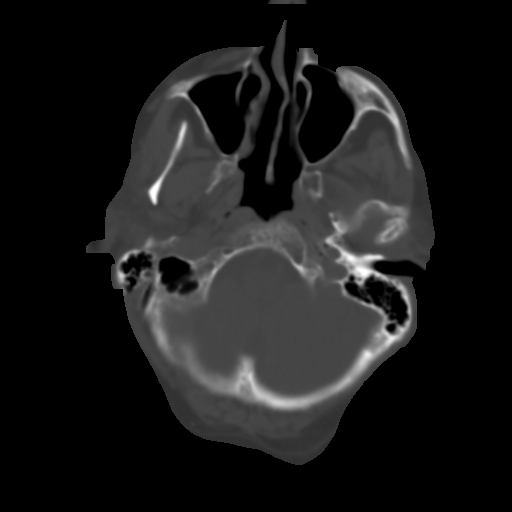
[im 8/31  brain]
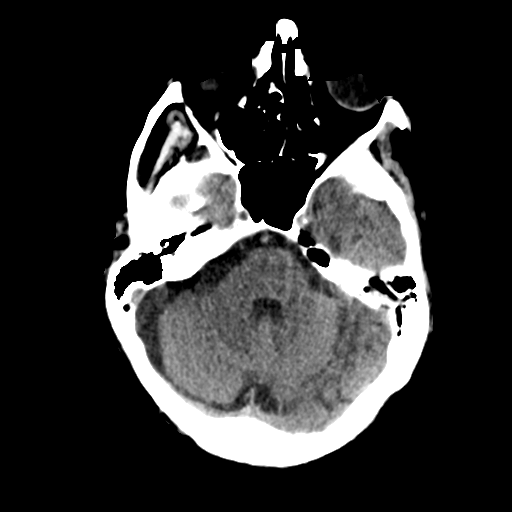
[im 12/31  brain]
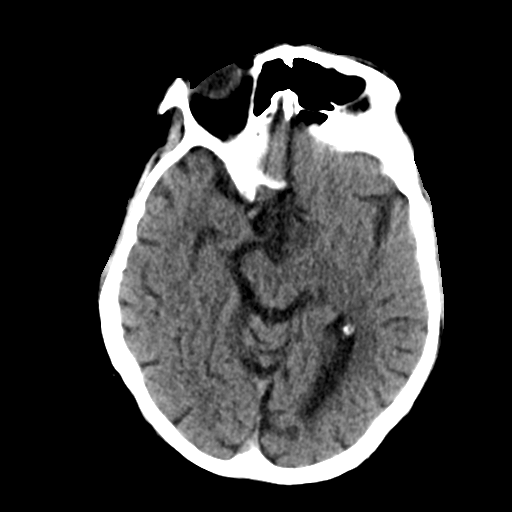
[im 16/31  brain]
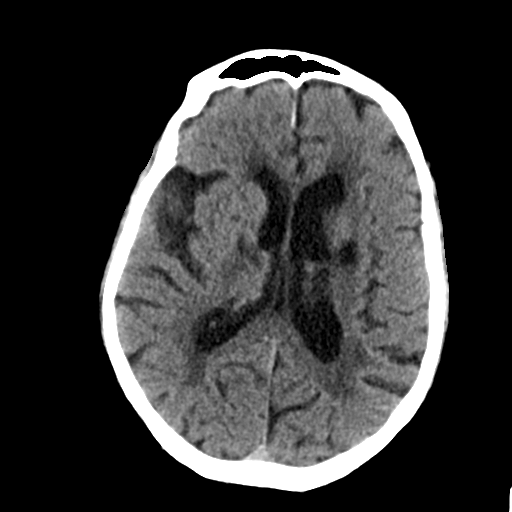
[im 19/31  brain]
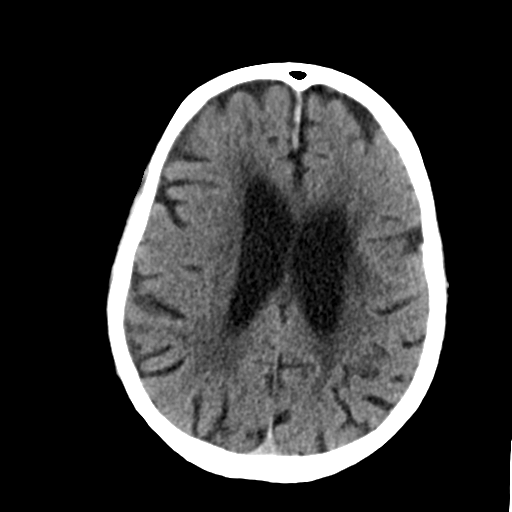
[im 19/31  bone]
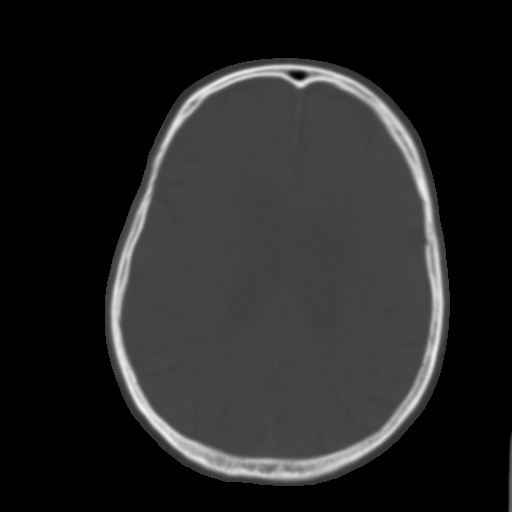
[im 23/31  brain]
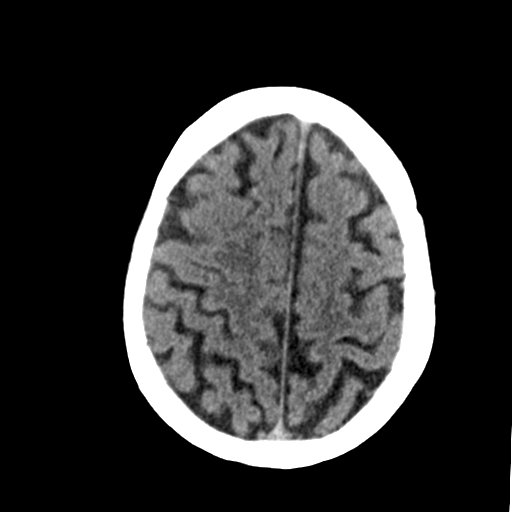
[im 27/31  brain]
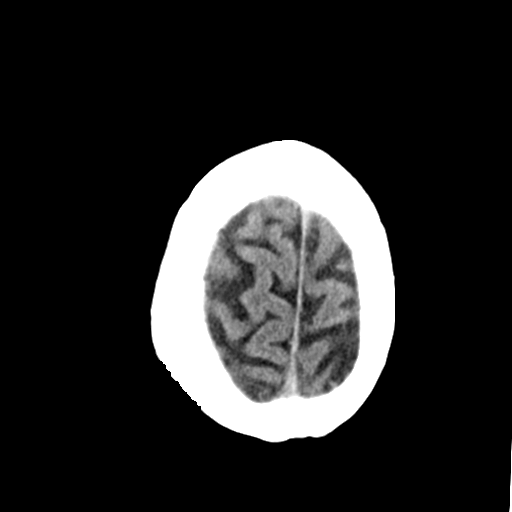

[15 of 30 positions shown; findings below may reference images not displayed]

FINDINGS: Bilateral basal ganglia lacunar infarcts. There is atrophy and
chronic small vessel disease changes. No acute intracranial
abnormality. Specifically, no hemorrhage, hydrocephalus, mass
lesion, acute infarction, or significant intracranial injury. No
acute calvarial abnormality. Visualized paranasal sinuses and
mastoids clear. Orbital soft tissues unremarkable.
IMPRESSION: Old bilateral basal ganglia lacunar infarcts.

No acute intracranial abnormality.

Atrophy, chronic microvascular disease.
